# Patient Record
Sex: Female | Born: 1940 | Hispanic: No | Smoking: Never smoker
Health system: Southern US, Community
[De-identification: ages and names within clinical notes are randomized; demographics above are authoritative.]

## PROBLEM LIST (undated history)

## (undated) DIAGNOSIS — F0280 Dementia in other diseases classified elsewhere without behavioral disturbance: Secondary | ICD-10-CM

## (undated) DIAGNOSIS — F32A Depression, unspecified: Secondary | ICD-10-CM

## (undated) DIAGNOSIS — G309 Alzheimer's disease, unspecified: Secondary | ICD-10-CM

## (undated) DIAGNOSIS — I639 Cerebral infarction, unspecified: Secondary | ICD-10-CM

## (undated) DIAGNOSIS — F329 Major depressive disorder, single episode, unspecified: Secondary | ICD-10-CM

## (undated) DIAGNOSIS — I2510 Atherosclerotic heart disease of native coronary artery without angina pectoris: Secondary | ICD-10-CM

## (undated) DIAGNOSIS — N183 Chronic kidney disease, stage 3 unspecified: Secondary | ICD-10-CM

## (undated) DIAGNOSIS — E46 Unspecified protein-calorie malnutrition: Secondary | ICD-10-CM

## (undated) DIAGNOSIS — I10 Essential (primary) hypertension: Secondary | ICD-10-CM

## (undated) DIAGNOSIS — F0390 Unspecified dementia without behavioral disturbance: Secondary | ICD-10-CM

## (undated) DIAGNOSIS — M810 Age-related osteoporosis without current pathological fracture: Secondary | ICD-10-CM

---

## 2008-05-27 ENCOUNTER — Ambulatory Visit: Payer: Self-pay | Admitting: Family Medicine

## 2008-05-27 LAB — CONVERTED CEMR LAB
AST: 17 units/L (ref 0–37)
Albumin: 4.2 g/dL (ref 3.5–5.2)
Alkaline Phosphatase: 96 units/L (ref 39–117)
BUN: 20 mg/dL (ref 6–23)
Basophils Relative: 0 % (ref 0–1)
Creatinine, Ser: 0.92 mg/dL (ref 0.40–1.20)
Eosinophils Absolute: 0.1 10*3/uL (ref 0.0–0.7)
Eosinophils Relative: 2 % (ref 0–5)
Glucose, Bld: 86 mg/dL (ref 70–99)
HCT: 40.3 % (ref 36.0–46.0)
HDL: 46 mg/dL (ref >39)
LDL Cholesterol: 81 mg/dL (ref 0–99)
Lymphs Abs: 1.7 10*3/uL (ref 0.7–4.0)
MCHC: 32 g/dL (ref 30.0–36.0)
MCV: 87.8 fL (ref 78.0–100.0)
Monocytes Absolute: 0.7 10*3/uL (ref 0.1–1.0)
Monocytes Relative: 11 % (ref 3–12)
RBC: 4.59 M/uL (ref 3.87–5.11)
TSH: 1.543 microintl units/mL (ref 0.350–4.50)
Total Bilirubin: 0.7 mg/dL (ref 0.3–1.2)
Total CHOL/HDL Ratio: 3.2
Triglycerides: 95 mg/dL (ref <150)
VLDL: 19 mg/dL (ref 0–40)
WBC: 6.1 10*3/uL (ref 4.0–10.5)

## 2008-06-23 ENCOUNTER — Ambulatory Visit: Payer: Self-pay | Admitting: Family Medicine

## 2008-06-28 ENCOUNTER — Ambulatory Visit (HOSPITAL_COMMUNITY): Admission: RE | Admit: 2008-06-28 | Discharge: 2008-06-28 | Payer: Self-pay | Admitting: Family Medicine

## 2008-07-12 ENCOUNTER — Ambulatory Visit: Payer: Self-pay | Admitting: Family Medicine

## 2008-08-25 ENCOUNTER — Ambulatory Visit: Payer: Self-pay | Admitting: Family Medicine

## 2008-08-26 ENCOUNTER — Ambulatory Visit (HOSPITAL_COMMUNITY): Admission: RE | Admit: 2008-08-26 | Discharge: 2008-08-26 | Payer: Self-pay | Admitting: Family Medicine

## 2008-09-12 ENCOUNTER — Encounter: Admission: RE | Admit: 2008-09-12 | Discharge: 2008-12-11 | Payer: Self-pay | Admitting: Family Medicine

## 2009-01-19 ENCOUNTER — Ambulatory Visit: Payer: Self-pay | Admitting: Family Medicine

## 2009-02-24 ENCOUNTER — Ambulatory Visit: Payer: Self-pay | Admitting: Internal Medicine

## 2009-02-27 ENCOUNTER — Ambulatory Visit: Payer: Self-pay | Admitting: Internal Medicine

## 2010-06-05 ENCOUNTER — Emergency Department (HOSPITAL_COMMUNITY): Admission: EM | Admit: 2010-06-05 | Discharge: 2010-06-05 | Payer: Self-pay | Admitting: Emergency Medicine

## 2010-10-31 LAB — CBC
MCH: 29.9 pg (ref 26.0–34.0)
MCHC: 33.8 g/dL (ref 30.0–36.0)
Platelets: 220 10*3/uL (ref 150–400)

## 2010-10-31 LAB — URINE MICROSCOPIC-ADD ON

## 2010-10-31 LAB — URINALYSIS, ROUTINE W REFLEX MICROSCOPIC
Bilirubin Urine: NEGATIVE
Glucose, UA: NEGATIVE mg/dL
Nitrite: NEGATIVE
Specific Gravity, Urine: 1.019 (ref 1.005–1.030)
pH: 7 (ref 5.0–8.0)

## 2010-10-31 LAB — BASIC METABOLIC PANEL
CO2: 29 mEq/L (ref 19–32)
Calcium: 10.3 mg/dL (ref 8.4–10.5)
Creatinine, Ser: 1.1 mg/dL (ref 0.4–1.2)
GFR calc Af Amer: >60 mL/min (ref >60)
GFR calc non Af Amer: 50 mL/min — ABNORMAL LOW (ref >60)
Glucose, Bld: 106 mg/dL — ABNORMAL HIGH (ref 70–99)

## 2010-10-31 LAB — URINE CULTURE

## 2010-10-31 LAB — DIFFERENTIAL
Basophils Absolute: 0 10*3/uL (ref 0.0–0.1)
Basophils Relative: 0 % (ref 0–1)
Eosinophils Absolute: 0.1 10*3/uL (ref 0.0–0.7)
Neutrophils Relative %: 84 % — ABNORMAL HIGH (ref 43–77)

## 2011-01-23 ENCOUNTER — Inpatient Hospital Stay (HOSPITAL_COMMUNITY)
Admission: EM | Admit: 2011-01-23 | Discharge: 2011-01-25 | DRG: 159 | Disposition: A | Discharge status: Nursing Home (Includes ICF) | Payer: Medicare Other | Attending: Internal Medicine | Admitting: Internal Medicine

## 2011-01-23 ENCOUNTER — Emergency Department (HOSPITAL_COMMUNITY): Payer: Medicare Other

## 2011-01-23 DIAGNOSIS — K047 Periapical abscess without sinus: Principal | ICD-10-CM | POA: Diagnosis present

## 2011-01-23 DIAGNOSIS — K029 Dental caries, unspecified: Secondary | ICD-10-CM | POA: Diagnosis present

## 2011-01-23 DIAGNOSIS — Z8673 Personal history of transient ischemic attack (TIA), and cerebral infarction without residual deficits: Secondary | ICD-10-CM

## 2011-01-23 DIAGNOSIS — F0390 Unspecified dementia without behavioral disturbance: Secondary | ICD-10-CM | POA: Diagnosis present

## 2011-01-23 DIAGNOSIS — I10 Essential (primary) hypertension: Secondary | ICD-10-CM | POA: Diagnosis present

## 2011-01-23 DIAGNOSIS — F329 Major depressive disorder, single episode, unspecified: Secondary | ICD-10-CM | POA: Diagnosis present

## 2011-01-23 DIAGNOSIS — F3289 Other specified depressive episodes: Secondary | ICD-10-CM | POA: Diagnosis present

## 2011-01-23 LAB — POCT I-STAT, CHEM 8
BUN: 20 mg/dL (ref 6–23)
Calcium, Ion: 1.21 mmol/L (ref 1.12–1.32)
Chloride: 103 mEq/L (ref 96–112)
Creatinine, Ser: 1.1 mg/dL (ref 0.4–1.2)
Glucose, Bld: 108 mg/dL — ABNORMAL HIGH (ref 70–99)

## 2011-01-23 LAB — DIFFERENTIAL
Eosinophils Absolute: 0.1 10*3/uL (ref 0.0–0.7)
Eosinophils Relative: 0 % (ref 0–5)
Lymphs Abs: 0.9 10*3/uL (ref 0.7–4.0)
Monocytes Relative: 9 % (ref 3–12)
Neutrophils Relative %: 85 % — ABNORMAL HIGH (ref 43–77)

## 2011-01-23 LAB — CBC
MCH: 28.7 pg (ref 26.0–34.0)
MCV: 86.4 fL (ref 78.0–100.0)
Platelets: 292 10*3/uL (ref 150–400)
RBC: 4.64 MIL/uL (ref 3.87–5.11)

## 2011-01-23 MED ORDER — IOHEXOL 300 MG/ML  SOLN
100.0000 mL | Freq: Once | INTRAMUSCULAR | Status: AC | PRN
  Administered 2011-01-23: 100 mL via INTRAVENOUS

## 2011-01-24 ENCOUNTER — Inpatient Hospital Stay (HOSPITAL_COMMUNITY): Payer: Medicare Other

## 2011-01-24 LAB — DIFFERENTIAL
Basophils Absolute: 0 10*3/uL (ref 0.0–0.1)
Eosinophils Relative: 1 % (ref 0–5)
Lymphocytes Relative: 11 % — ABNORMAL LOW (ref 12–46)
Monocytes Absolute: 1.3 10*3/uL — ABNORMAL HIGH (ref 0.1–1.0)

## 2011-01-24 LAB — CBC
HCT: 35.6 % — ABNORMAL LOW (ref 36.0–46.0)
MCH: 28.8 pg (ref 26.0–34.0)
MCHC: 33.4 g/dL (ref 30.0–36.0)
MCV: 86.2 fL (ref 78.0–100.0)
RDW: 13.1 % (ref 11.5–15.5)

## 2011-01-24 LAB — COMPREHENSIVE METABOLIC PANEL
BUN: 14 mg/dL (ref 6–23)
Calcium: 9.2 mg/dL (ref 8.4–10.5)
Creatinine, Ser: 0.79 mg/dL (ref 0.4–1.2)
Glucose, Bld: 121 mg/dL — ABNORMAL HIGH (ref 70–99)
Sodium: 137 mEq/L (ref 135–145)
Total Protein: 7.1 g/dL (ref 6.0–8.3)

## 2011-01-24 LAB — MAGNESIUM: Magnesium: 2.2 mg/dL (ref 1.5–2.5)

## 2011-01-24 NOTE — H&P (Signed)
Alexa Knox, SCHILLER             ACCOUNT NO.:  [id number]  MEDICAL RECORD NO.:  [id number]  LOCATION:  WLED                         FACILITY:  [facility]  PHYSICIAN:  Talmage Nap, MD  DATE OF BIRTH:  02-Feb-1941  DATE OF ADMISSION:  01/23/2011 DATE OF DISCHARGE:                             HISTORY & PHYSICAL   PRIMARY CARE PHYSICIAN:  Unassigned.  History obtainable from the patient (the patient not a very good historian).  CHIEF COMPLAINT:  Right jaw swelling of about 4 days' duration.  HISTORY OF PRESENT ILLNESS:  The patient is a 70 year old Caucasian female who is resident of an assisted living facility at [facility] with a history of hypertension, dementia, and CVA, presented to the emergency room with right jaw swelling of about 4-5 days' duration.  The swelling is said to be getting progressively worse.  This was said to be associated with fever. She denied, any history of chills or rigor. She denied any dysphagia.  She denied any chest pain.  No shortness of breath.  She denied any history of headaches.  No neck stiffness.  No cough.  The right jaw swelling was said to be getting progressive worse, hence the patient presented to the emergency room to be evaluated.  PAST MEDICAL HISTORY:  Positive for: 1. Hypertension. 2. CVA. 3. Depression. 4. Dementia.  PAST SURGICAL HISTORY:  No known past surgical history.  MEDICATIONS:  Her preadmission medications include: 1. Alendronate 70 mg p.o. daily. 2. Bupropion 150 mg p.o. daily. 3. Diphenhydramine HCL 25 mg p.o. p.r.n. 4. Hydrochlorothiazide 25 mg p.o. daily. 5. Lorazepam 0.5 mg p.o. p.r.n. 6. Nystatin topical apply p.r.n. 7. Vitamin D2 dose unknown once a day.  ALLERGIES:  She has no known drug allergies.  SOCIAL HISTORY:  Negative for alcohol or tobacco use.  She is a resident of assisted living facility at [facility].  FAMILY HISTORY:  Unknown.  REVIEW OF SYSTEMS:  She denies any headache.  No blurry  vision.  No neck stiffness.  Complaining about pain and swelling of the right jaw with associated fever.  Denied any chills or rigor.  No dysphagia.  No cough. No chest pain.  No shortness of breath.  No abdominal discomfort.  No diarrhea or hematochezia.  No dysuria or hematuria. No swelling of the lower extremities.  No intolerance to heat or cold. No neuropsychiatric disorder.  PHYSICAL EXAMINATION:  GENERAL:  Elderly lady with suboptimal hydration, not in any respiratory distress at present. VITAL SIGNS:  Blood pressure 137/80, pulse 92, respiratory rate 16, temperature is 101.5. HEENT:  Pupils are reactive to light.  Extraocular muscles are intact. NECK:  Right jaw swelling with surrounding erythema, warm to touch and tender.  No lymphadenopathy.  Examination of mouth showed very poor dentition with surrounding erythema around the right molar teeth. CHEST:  Clear to auscultation. HEART:  S1 and S2. ABDOMEN:  Soft and nontender.  Liver, spleen, and kidneys not palpable. Bowel sounds are positive. EXTREMITIES:  No pedal edema. NEUROLOGIC:  Nonfocal. MUSCULOSKELETAL:  Unremarkable. SKIN:  Decreased turgor.  LABORATORY DATA:  Initial hematologic indices showed WBC of 14.3, hemoglobin of 13.3, hematocrit of 40.1, MCV 86.5, with platelet count of 292,000, neutrophils 85%,  absolute neutrophil count is 12.1.  Chemistry showed sodium of 139, potassium of 3.5, chloride of 102, BUN 20, creatinine 1.10, glucose 108.  CT of the maxillofacial with contrast showed a large dental abscess on the floor of the mouth measuring 3.7 cm, this extends to the submandibular gland which is markedly enlarged.   IMPRESSION: 1. Right jaw swelling secondary to dental abscess. 2. Right molar teeth periodontitis with gingivitis. 3. Depression. 4. Hypertension. 5. History of cerebrovascular accident, no neurologic deficits. 6. Dementia.  PLAN:  Admit the patient to general medical floor.  The patient  will be n.p.o. for now.  She will be on D5 half normal saline IV to go at a rate of 75 cc an hour.  She will be started on antibiotic i.e clindamycin 600 mg IV q.8 hours.  Pain control will be with morphine 2 mg IV q.4 h. p.r.n. and fever will be controlled with Tylenol suppository 1 q.4h. p.r.n. for fever.  GI prophylaxis will be with Protonix 40 mg IV q.24 h. and DVT prophylaxis with TED stockings or SCD boots.  Further workup to be done on the patient will include CBC and CMP, will be repeated in a.m. Blood cultures x2 before starting IV antibiotics.  I was informed  by the ED physician that the oral surgeon, Dr. Gwendlyn Deutscher was aware of this patient and will evaluate the patient in a.m.  The patient is on the floor and will be evaluated on day-to-day basis.    Talmage Nap, MD    CN/MEDQ  D:  01/23/2011  T:  01/23/2011  Job:  [id number]  Electronically Signed by Talmage Nap  on 01/24/2011 02:50:34 AM

## 2011-01-30 LAB — CULTURE, BLOOD (ROUTINE X 2)
Culture  Setup Time: 201206070438
Culture: NO GROWTH

## 2011-02-04 NOTE — Discharge Summary (Signed)
[name], Knox             ACCOUNT NO.:  [id number]  MEDICAL RECORD NO.:  [id number]  LOCATION:  1314                         FACILITY:  [facility]  PHYSICIAN:  Marinda Elk, M.D.DATE OF BIRTH:  07/30/1941  DATE OF ADMISSION:  01/23/2011 DATE OF DISCHARGE:                         DISCHARGE SUMMARY-REFERRING   PRIMARY CARE DOCTOR:  None.  DISCHARGE DIAGNOSES: 1. Dental abscess. 2. Hypertension.  DISCHARGE MEDICATIONS: 1. Clindamycin 600 mg t.i.d. for 6 days. 2. Bupropion XL 150 mg daily. 3. Benadryl 25 mg q.6 h. 4. Donazepil 10 mg daily. 5. Hydrochlorothiazide 12.5 mg daily. 6. Lorazepam 0.5 mg 1 tab q.6 h. as needed. 7. Multivitamin 1 tab daily. 8. Nystatin topically b.i.d. to buttcoks and upper thigh. 9. Vitamin D2 400 units daily.  PROCEDURES PERFORMED:  Mouth panogram  showed right lower bridging transferred  between molar and premolar with lucency along the premolar which may represent a source.  There are,  however, significant caries and loosening of residual lower canine roots.  CT scan of the maxillofacial  area:  Large abscess  in the floor of mouth likely due to dental disease.  This process extends down to the submandibular gland which is markedly  inflamed.  BRIEF ADMITTING HISTORY AND PHYSICAL:  This is a 70 year old Caucasian female who resides in ALF, brought to the emergency room because of jaw swelling.  The swelling said to be progressively getting worse.  This said to be associated with fever.  She denies any history of chills , regurg,  dysphagia.  Denies any chest pain, shortness of breath.  No cough, no right jaw swelling, said to be progressively getting worse which has limited her eating.  PHYSICAL EXAMINATION:  VITAL SIGNS:  Blood pressure 137/80, pulse of 92, respirations 16, temperature 101.5.  Please refer to dictation from January 24, 2011, for further details.  LABS ON ADMISSION:  White count of 14, hemoglobin of 13, platelet  count of 292,000, MCV of 86, ANC of 12.1.  Sodium 139, potassium 3.5, chloride 102, BUN of 20, creatinine 1.0, glucose of 108.  CT scan shows results above.  ASSESSMENT/PLAN: 1. Right jaw swelling secondary to dental abscess.  She was started on     clindamycin.  Then, maxillofacial was consulted.  They recommended     that due to her improvement with her antibiotic, they could do it     probably as an outpatient.  She was continuing clindamycin and     continued to do well.  She defervesced, her white count came down.     She was scheduled to follow up appointment on June 11 for     extraction of this abscess.  In the meantime, she will continue her     clindamycin p.o.  She has tolerated her diet well in the hospital.     She was discharged in stable addition. 2. Hypertension, currently well-controlled.  No changes were made. 3. Depression.  No changes were made. 4. Dementia.  No changes were made to the medication.  DISPOSITION:  The patient will follow up with the dentist as an outpatient.  Here we will do the procedure to extract and debride the source of the infection.  Her  appointment has already been set up by her dental doctor.  We will discuss with the family that she needs to follow up with this appointment.  On the day of discharge, temperature is 97, pulse 68, respirations 17, blood pressure 115/73.  She was satting 95% on room air.     Marinda Elk, M.D.     AF/MEDQ  D:  01/25/2011  T:  01/25/2011  Job:  [id number]  cc:   Saddie Benders, D.D.S. Fax: [phone]  Electronically Signed by Marinda Elk M.D. on 02/04/2011 10:57:20 AM

## 2011-03-23 ENCOUNTER — Emergency Department (HOSPITAL_COMMUNITY)
Admission: EM | Admit: 2011-03-23 | Discharge: 2011-03-23 | Disposition: A | Discharge status: Home / Self Care | Payer: Medicare Other | Attending: Emergency Medicine | Admitting: Emergency Medicine

## 2011-03-23 ENCOUNTER — Emergency Department (HOSPITAL_COMMUNITY): Payer: Medicare Other

## 2011-03-23 DIAGNOSIS — I10 Essential (primary) hypertension: Secondary | ICD-10-CM | POA: Insufficient documentation

## 2011-03-23 DIAGNOSIS — Z66 Do not resuscitate: Secondary | ICD-10-CM | POA: Insufficient documentation

## 2011-03-23 DIAGNOSIS — M810 Age-related osteoporosis without current pathological fracture: Secondary | ICD-10-CM | POA: Insufficient documentation

## 2011-03-23 DIAGNOSIS — F068 Other specified mental disorders due to known physiological condition: Secondary | ICD-10-CM | POA: Insufficient documentation

## 2011-03-23 DIAGNOSIS — S62639B Displaced fracture of distal phalanx of unspecified finger, initial encounter for open fracture: Secondary | ICD-10-CM | POA: Insufficient documentation

## 2011-03-23 DIAGNOSIS — Z8673 Personal history of transient ischemic attack (TIA), and cerebral infarction without residual deficits: Secondary | ICD-10-CM | POA: Insufficient documentation

## 2013-08-10 ENCOUNTER — Emergency Department (HOSPITAL_COMMUNITY)
Admission: EM | Admit: 2013-08-10 | Discharge: 2013-08-10 | Disposition: A | Discharge status: Home / Self Care | Payer: Medicare Other | Attending: Emergency Medicine | Admitting: Emergency Medicine

## 2013-08-10 ENCOUNTER — Emergency Department (HOSPITAL_COMMUNITY): Payer: Medicare Other

## 2013-08-10 ENCOUNTER — Encounter (HOSPITAL_COMMUNITY): Payer: Self-pay | Admitting: Emergency Medicine

## 2013-08-10 DIAGNOSIS — F329 Major depressive disorder, single episode, unspecified: Secondary | ICD-10-CM | POA: Insufficient documentation

## 2013-08-10 DIAGNOSIS — F0390 Unspecified dementia without behavioral disturbance: Secondary | ICD-10-CM | POA: Insufficient documentation

## 2013-08-10 DIAGNOSIS — F3289 Other specified depressive episodes: Secondary | ICD-10-CM | POA: Insufficient documentation

## 2013-08-10 DIAGNOSIS — M810 Age-related osteoporosis without current pathological fracture: Secondary | ICD-10-CM | POA: Insufficient documentation

## 2013-08-10 DIAGNOSIS — E860 Dehydration: Secondary | ICD-10-CM | POA: Insufficient documentation

## 2013-08-10 DIAGNOSIS — I10 Essential (primary) hypertension: Secondary | ICD-10-CM | POA: Insufficient documentation

## 2013-08-10 DIAGNOSIS — Z7982 Long term (current) use of aspirin: Secondary | ICD-10-CM | POA: Insufficient documentation

## 2013-08-10 DIAGNOSIS — Z7983 Long term (current) use of bisphosphonates: Secondary | ICD-10-CM | POA: Insufficient documentation

## 2013-08-10 DIAGNOSIS — Z79899 Other long term (current) drug therapy: Secondary | ICD-10-CM | POA: Insufficient documentation

## 2013-08-10 DIAGNOSIS — I951 Orthostatic hypotension: Secondary | ICD-10-CM | POA: Insufficient documentation

## 2013-08-10 DIAGNOSIS — Z8639 Personal history of other endocrine, nutritional and metabolic disease: Secondary | ICD-10-CM | POA: Insufficient documentation

## 2013-08-10 DIAGNOSIS — R231 Pallor: Secondary | ICD-10-CM | POA: Insufficient documentation

## 2013-08-10 DIAGNOSIS — I2510 Atherosclerotic heart disease of native coronary artery without angina pectoris: Secondary | ICD-10-CM | POA: Insufficient documentation

## 2013-08-10 HISTORY — DX: Atherosclerotic heart disease of native coronary artery without angina pectoris: I25.10

## 2013-08-10 HISTORY — DX: Essential (primary) hypertension: I10

## 2013-08-10 HISTORY — DX: Age-related osteoporosis without current pathological fracture: M81.0

## 2013-08-10 HISTORY — DX: Major depressive disorder, single episode, unspecified: F32.9

## 2013-08-10 HISTORY — DX: Unspecified protein-calorie malnutrition: E46

## 2013-08-10 HISTORY — DX: Depression, unspecified: F32.A

## 2013-08-10 HISTORY — DX: Unspecified dementia, unspecified severity, without behavioral disturbance, psychotic disturbance, mood disturbance, and anxiety: F03.90

## 2013-08-10 LAB — CBC WITH DIFFERENTIAL/PLATELET
Basophils Absolute: 0 10*3/uL (ref 0.0–0.1)
Eosinophils Relative: 3 % (ref 0–5)
HCT: 37.8 % (ref 36.0–46.0)
Hemoglobin: 12.2 g/dL (ref 12.0–15.0)
Lymphocytes Relative: 17 % (ref 12–46)
MCV: 92.4 fL (ref 78.0–100.0)
Monocytes Absolute: 0.7 10*3/uL (ref 0.1–1.0)
Monocytes Relative: 13 % — ABNORMAL HIGH (ref 3–12)
Neutro Abs: 3.6 10*3/uL (ref 1.7–7.7)
RDW: 13.6 % (ref 11.5–15.5)
WBC: 5.3 10*3/uL (ref 4.0–10.5)

## 2013-08-10 LAB — VALPROIC ACID LEVEL: Valproic Acid Lvl: 30.5 ug/mL — ABNORMAL LOW (ref 50.0–100.0)

## 2013-08-10 LAB — COMPREHENSIVE METABOLIC PANEL
BUN: 29 mg/dL — ABNORMAL HIGH (ref 6–23)
CO2: 25 mEq/L (ref 19–32)
Calcium: 8.6 mg/dL (ref 8.4–10.5)
Chloride: 108 mEq/L (ref 96–112)
Creatinine, Ser: 0.99 mg/dL (ref 0.50–1.10)
GFR calc Af Amer: 65 mL/min — ABNORMAL LOW (ref >90)
GFR calc non Af Amer: 56 mL/min — ABNORMAL LOW (ref >90)
Glucose, Bld: 101 mg/dL — ABNORMAL HIGH (ref 70–99)
Total Bilirubin: 0.4 mg/dL (ref 0.3–1.2)

## 2013-08-10 LAB — TROPONIN I: Troponin I: <0.3 ng/mL (ref <0.30)

## 2013-08-10 LAB — URINALYSIS, ROUTINE W REFLEX MICROSCOPIC
Glucose, UA: NEGATIVE mg/dL
Ketones, ur: NEGATIVE mg/dL
Leukocytes, UA: NEGATIVE
Protein, ur: NEGATIVE mg/dL
Urobilinogen, UA: 0.2 mg/dL (ref 0.0–1.0)

## 2013-08-10 MED ORDER — SODIUM CHLORIDE 0.9 % IV BOLUS (SEPSIS)
700.0000 mL | Freq: Once | INTRAVENOUS | Status: AC
  Administered 2013-08-10: 700 mL via INTRAVENOUS

## 2013-08-10 NOTE — ED Notes (Signed)
Bed: WA19 Expected date:  Expected time:  Means of arrival:  Comments: EMS 

## 2013-08-10 NOTE — ED Notes (Signed)
Per EMS: Pt from [facility].  Called out for syncope.  On scene, pt's BP was 108 palp laying down.  Upon sitting, BP dropped to 70/40.  Did not attempt standing BP.  Pt has 22 g IV in lt hand.  Pt is confused.  Staff at nursing facility states that she is normally "sharp as a tack".

## 2013-08-10 NOTE — Progress Notes (Signed)
   CARE MANAGEMENT ED NOTE 08/10/2013  Patient:  Alexa Knox, Alexa Knox   Account Number:  [id number]  Date Initiated:  08/10/2013  Documentation initiated by:  Edd Arbour  Subjective/Objective Assessment:   72 yr old female pcp henry tripp per EDP note     Subjective/Objective Assessment Detail:     Action/Plan:   EPIC updated   Action/Plan Detail:   Anticipated DC Date:  08/10/2013     Status Recommendation to Physician:   Result of Recommendation:    Other ED Services  Consult Working Plan    DC Planning Services  Outpatient Services - Pt will follow up  Other    Choice offered to / List presented to:            Status of service:  Completed, signed off  ED Comments:   ED Comments Detail:

## 2013-08-10 NOTE — ED Provider Notes (Signed)
CSN: [id number]     Arrival date & time 08/10/13  0806 History   First MD Initiated Contact with Patient 08/10/13 0820     Chief Complaint  Patient presents with  . Loss of Consciousness   Level V caveat for dementia  (Consider location/radiation/quality/duration/timing/severity/associated sxs/prior Treatment) HPI Patient presents from her nursing home for apparent syncopal episode. According to the nursing home paper it states patient passed out and was unable to walk. EMS attempted to do orthostatics on the patient. Her blood pressure supine was 108 however when they sat her up her blood pressure dropped to 70/40. When I asked the patient how she's feeling she states "I feel fine". She denies headache, chest pain, or feeling bad in any way. She states she was up for the day had already eaten breakfast however I am unsure if this is true or not. Patient states she is in a hospital, nursing tech states she had oriented her just prior to my coming into the room.  PCP [name]  Patient is DO NOT RESUSCITATE  Past Medical History  Diagnosis Date  . Osteoporosis   . Dementia   . Depression   . Hypertension   . Malnutrition   . Coronary artery disease    History reviewed. No pertinent past surgical history. History reviewed. No pertinent family history. History  Substance Use Topics  . Smoking status: Never Smoker   . Smokeless tobacco: Not on file  . Alcohol Use: No   Lives in nursing home   OB History   Grav Para Term Preterm Abortions TAB SAB Ect Mult Living                 Review of Systems  Unable to perform ROS: Dementia    Allergies  Review of patient's allergies indicates no known allergies.  Home Medications   Current Outpatient Rx  Name  Route  Sig  Dispense  Refill  . alendronate (FOSAMAX) 70 MG tablet   Oral   Take 70 mg by mouth once a week. Take with a full glass of water on an empty stomach. Patient takes on Sat.         Marland Kitchen aspirin 81 MG tablet    Oral   Take 81 mg by mouth daily.         Marland Kitchen buPROPion (WELLBUTRIN SR) 150 MG 12 hr tablet   Oral   Take 150 mg by mouth every morning.         . chlorhexidine (PERIDEX) 0.12 % solution   Mouth/Throat   Use as directed 15 mLs in the mouth or throat 2 (two) times daily.         . divalproex (DEPAKOTE SPRINKLE) 125 MG capsule   Oral   Take 250 mg by mouth 3 (three) times daily. With meals, may open capsule and sprinkle on pudding         . donepezil (ARICEPT) 10 MG tablet   Oral   Take 10 mg by mouth daily.         Marland Kitchen LORazepam (ATIVAN) 0.5 MG tablet   Oral   Take 0.5 mg by mouth every 6 (six) hours as needed for anxiety (agitation).         . Memantine HCl ER (NAMENDA XR) 7 MG CP24   Oral   Take 1 capsule by mouth daily.         . Multiple Vitamin (DAILY VITE) TABS   Oral   Take 1 tablet  by mouth daily.         . vitamin D, CHOLECALCIFEROL, 400 UNITS tablet   Oral   Take 400 Units by mouth daily.          BP 107/50  Pulse 62  Temp(Src) 97.1 F (36.2 C) (Oral)  Resp 16  SpO2 98%  Vital signs normal   Physical Exam  Nursing note and vitals reviewed. Constitutional: She appears well-developed and well-nourished.  Non-toxic appearance. She does not appear ill. No distress.  HENT:  Head: Normocephalic and atraumatic.  Right Ear: External ear normal.  Left Ear: External ear normal.  Nose: Nose normal. No mucosal edema or rhinorrhea.  Mouth/Throat: Mucous membranes are normal. No dental abscesses or uvula swelling.  edentulous  Eyes: Conjunctivae and EOM are normal. Pupils are equal, round, and reactive to light.  Neck: Normal range of motion and full passive range of motion without pain. Neck supple.  Cardiovascular: Normal rate, regular rhythm and normal heart sounds.  Exam reveals no gallop and no friction rub.   No murmur heard. Pulmonary/Chest: Effort normal and breath sounds normal. No respiratory distress. She has no wheezes. She has no  rhonchi. She has no rales. She exhibits no tenderness and no crepitus.  Abdominal: Soft. Normal appearance and bowel sounds are normal. She exhibits no distension. There is no tenderness. There is no rebound and no guarding.  Musculoskeletal: Normal range of motion. She exhibits no edema and no tenderness.  Moves all extremities well. No deformity seen. She has no pain on range of motion of her knees, hips, elbows or shoulders.  Neurological: She is alert. She has normal strength. No cranial nerve deficit.  Follows commands  Skin: Skin is warm, dry and intact. No rash noted. No erythema. There is pallor.  Psychiatric: She has a normal mood and affect. Her speech is normal and behavior is normal. Her mood appears not anxious.    ED Course  Procedures (including critical care time) Medications  sodium chloride 0.9 % bolus 700 mL (700 mLs Intravenous New Bag/Given 08/10/13 0856)    Recheck. Patient does not remember me seeing her before. She states she feels fine. Her orthostatics are normal now after getting IV fluids. She will be sent back to her nursing facility.  Labs Review Results for orders placed during the hospital encounter of 08/10/13  CBC WITH DIFFERENTIAL      Result Value Range   WBC 5.3  4.0 - 10.5 K/uL   RBC 4.09  3.87 - 5.11 MIL/uL   Hemoglobin 12.2  12.0 - 15.0 g/dL   HCT 16.1  09.6 - 04.5 %   MCV 92.4  78.0 - 100.0 fL   MCH 29.8  26.0 - 34.0 pg   MCHC 32.3  30.0 - 36.0 g/dL   RDW 40.9  81.1 - 91.4 %   Platelets 130 (*) 150 - 400 K/uL   Neutrophils Relative % 67  43 - 77 %   Neutro Abs 3.6  1.7 - 7.7 K/uL   Lymphocytes Relative 17  12 - 46 %   Lymphs Abs 0.9  0.7 - 4.0 K/uL   Monocytes Relative 13 (*) 3 - 12 %   Monocytes Absolute 0.7  0.1 - 1.0 K/uL   Eosinophils Relative 3  0 - 5 %   Eosinophils Absolute 0.1  0.0 - 0.7 K/uL   Basophils Relative 0  0 - 1 %   Basophils Absolute 0.0  0.0 - 0.1 K/uL  COMPREHENSIVE METABOLIC  PANEL      Result Value Range    Sodium 140  135 - 145 mEq/L   Potassium 4.6  3.5 - 5.1 mEq/L   Chloride 108  96 - 112 mEq/L   CO2 25  19 - 32 mEq/L   Glucose, Bld 101 (*) 70 - 99 mg/dL   BUN 29 (*) 6 - 23 mg/dL   Creatinine, Ser 1.61  0.50 - 1.10 mg/dL   Calcium 8.6  8.4 - 09.6 mg/dL   Total Protein 6.4  6.0 - 8.3 g/dL   Albumin 3.1 (*) 3.5 - 5.2 g/dL   AST 18  0 - 37 U/L   ALT 17  0 - 35 U/L   Alkaline Phosphatase 55  39 - 117 U/L   Total Bilirubin 0.4  0.3 - 1.2 mg/dL   GFR calc non Af Amer 56 (*) >90 mL/min   GFR calc Af Amer 65 (*) >90 mL/min  TROPONIN I      Result Value Range   Troponin I <0.30  <0.30 ng/mL  URINALYSIS, ROUTINE W REFLEX MICROSCOPIC      Result Value Range   Color, Urine YELLOW  YELLOW   APPearance CLOUDY (*) CLEAR   Specific Gravity, Urine 1.015  1.005 - 1.030   pH 7.5  5.0 - 8.0   Glucose, UA NEGATIVE  NEGATIVE mg/dL   Hgb urine dipstick NEGATIVE  NEGATIVE   Bilirubin Urine NEGATIVE  NEGATIVE   Ketones, ur NEGATIVE  NEGATIVE mg/dL   Protein, ur NEGATIVE  NEGATIVE mg/dL   Urobilinogen, UA 0.2  0.0 - 1.0 mg/dL   Nitrite NEGATIVE  NEGATIVE   Leukocytes, UA NEGATIVE  NEGATIVE  VALPROIC ACID LEVEL      Result Value Range   Valproic Acid Lvl 30.5 (*) 50.0 - 100.0 ug/mL   Laboratory interpretation all normal except subtherapeutic valproic acid level    Imaging Review Ct Head Wo Contrast  Ct Cervical Spine Wo Contrast  08/10/2013   CLINICAL DATA:  Loss of consciousness with questionable trauma  EXAM: CT HEAD WITHOUT CONTRAST  CT CERVICAL SPINE WITHOUT CONTRAST  TECHNIQUE: Multidetector CT imaging of the head and cervical spine was performed following the standard protocol without intravenous contrast. Multiplanar CT image reconstructions of the cervical spine were also generated.  COMPARISON:  Brain CT June 05, 2010  FINDINGS: CT HEAD FINDINGS  There is moderate diffuse atrophy. There is no mass, hemorrhage, extra-axial fluid collection, or midline shift. There is small vessel  disease throughout the centra semiovale bilaterally. There is evidence of a prior infarct in the inferior posterior right parietal lobe, stable. There is no new gray-white compartment lesion. No acute infarct apparent.  Bony calvarium appears intact. Mastoid air cells are clear, although mastoids on the right are hypoplastic. There is debris in the right external auditory canal.  CT CERVICAL SPINE FINDINGS  There is no fracture or appreciable spondylolisthesis. Prevertebral soft tissues and predental space regions are normal.  There is marked disc space narrowing at C5-6 and C6-7. There is moderate disc space narrowing at C3-4 and C4-5. There is facet hypertrophy at essentially all levels bilaterally. No disc extrusion or stenosis.  IMPRESSION: CT head: Atrophy with supratentorial small vessel disease. Prior infarct at the gray - white junction in the posterior inferior right parietal lobe. No intracranial mass, hemorrhage, or acute appearing infarct. Probable cerumen in the right external auditory canal.  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 No fracture or spondylolisthesis.  Electronically Signed   By: Bretta Bang M.D.   On: 08/10/2013 09:41   Dg Chest Portable 1 View  08/10/2013   CLINICAL DATA:  Loss consciousness, history of hypertension  EXAM: PORTABLE CHEST - 1 VIEW  COMPARISON:  None.  FINDINGS: The cardiac silhouette is enlarged. Low lung volumes. Blunting of the left costophrenic angle. Increased density projects within the left lower lobe and lingula regions. No focal regions of consolidation. The osseous structures are unremarkable.  IMPRESSION: Atelectasis versus infiltrate left lower lobe and lingula regions. Small effusion versus chronic scarring left costophrenic angle region.   Electronically Signed   By: Salome Holmes M.D.   On: 08/10/2013 09:09    EKG Interpretation    Date/Time:  Tuesday August 10 2013 08:33:32 EST Ventricular Rate:   63 PR Interval:  187 QRS Duration: 95 QT Interval:  397 QTC Calculation: 406 R Axis:   -2 Text Interpretation:  Sinus rhythm Baseline wander Since last tracing rate slower Confirmed by Joni Colegrove  MD-I, Quintella Mura ([id number]) on 08/10/2013 9:03:18 AM            MDM   1. Orthostatic hypotension   2. Dehydration    Plan discharge  Devoria Albe, MD, Franz Dell, MD 08/10/13 1128

## 2013-10-16 ENCOUNTER — Inpatient Hospital Stay (HOSPITAL_COMMUNITY)
Admission: EM | Admit: 2013-10-16 | Discharge: 2013-10-19 | DRG: 087 | Disposition: A | Discharge status: Skilled Nursing Facility | Payer: Medicare Other | Attending: Internal Medicine | Admitting: Internal Medicine

## 2013-10-16 ENCOUNTER — Emergency Department (HOSPITAL_COMMUNITY): Payer: Medicare Other

## 2013-10-16 ENCOUNTER — Encounter (HOSPITAL_COMMUNITY): Payer: Self-pay | Admitting: Emergency Medicine

## 2013-10-16 DIAGNOSIS — S06330A Contusion and laceration of cerebrum, unspecified, without loss of consciousness, initial encounter: Principal | ICD-10-CM | POA: Diagnosis present

## 2013-10-16 DIAGNOSIS — W06XXXA Fall from bed, initial encounter: Secondary | ICD-10-CM | POA: Diagnosis present

## 2013-10-16 DIAGNOSIS — F329 Major depressive disorder, single episode, unspecified: Secondary | ICD-10-CM | POA: Diagnosis present

## 2013-10-16 DIAGNOSIS — M810 Age-related osteoporosis without current pathological fracture: Secondary | ICD-10-CM | POA: Diagnosis present

## 2013-10-16 DIAGNOSIS — I2510 Atherosclerotic heart disease of native coronary artery without angina pectoris: Secondary | ICD-10-CM | POA: Diagnosis present

## 2013-10-16 DIAGNOSIS — F0390 Unspecified dementia without behavioral disturbance: Secondary | ICD-10-CM | POA: Diagnosis present

## 2013-10-16 DIAGNOSIS — I619 Nontraumatic intracerebral hemorrhage, unspecified: Secondary | ICD-10-CM

## 2013-10-16 DIAGNOSIS — I10 Essential (primary) hypertension: Secondary | ICD-10-CM | POA: Diagnosis present

## 2013-10-16 DIAGNOSIS — F3289 Other specified depressive episodes: Secondary | ICD-10-CM | POA: Diagnosis present

## 2013-10-16 DIAGNOSIS — Z79899 Other long term (current) drug therapy: Secondary | ICD-10-CM

## 2013-10-16 DIAGNOSIS — Y9210 Unspecified residential institution as the place of occurrence of the external cause: Secondary | ICD-10-CM | POA: Diagnosis present

## 2013-10-16 DIAGNOSIS — S0633AA Contusion and laceration of cerebrum, unspecified, with loss of consciousness status unknown, initial encounter: Principal | ICD-10-CM | POA: Diagnosis present

## 2013-10-16 DIAGNOSIS — Z66 Do not resuscitate: Secondary | ICD-10-CM | POA: Diagnosis present

## 2013-10-16 DIAGNOSIS — Z7982 Long term (current) use of aspirin: Secondary | ICD-10-CM

## 2013-10-16 NOTE — ED Notes (Signed)
Patient was seen in bed, patient was found 5 minutes later and found her on the floor.  Patient does have a hematoma on right forehead.  Patient is not complaining of any pain at this time.  [facility]  Patient has history of dementia.  Patient is CAOx normal baseline.

## 2013-10-16 NOTE — ED Notes (Signed)
[facility]

## 2013-10-16 NOTE — ED Provider Notes (Signed)
CSN: [id number]     Arrival date & time 10/16/13  2051 History   First MD Initiated Contact with Patient 10/16/13 2056     Chief Complaint  Patient presents with  . Fall   HPI Comments: 73 yo F hx of dementia, osteoporosis, HTN, CAD, presents via EMS from [facility]isted [facility] s/p unwitnessed fall.  Pt was found 1-2 hours ago lying on floor around her bed.  Pt has dementia an unable to give history.  Last seen in bed a few hours earlier.  Pt had evidence of some head trauma, with bruising and swelling to right forehead noted by staff.  EMS was called and pt brought to ED for further evaluation.  On arrival, pt has no complaints, but she has dementia and unable to participate in full H&P.    The history is provided by a relative. No language interpreter was used.    Past Medical History  Diagnosis Date  . Osteoporosis   . Dementia   . Depression   . Hypertension   . Malnutrition   . Coronary artery disease    History reviewed. No pertinent past surgical history. No family history on file. History  Substance Use Topics  . Smoking status: Never Smoker   . Smokeless tobacco: Not on file  . Alcohol Use: No   OB History   Grav Para Term Preterm Abortions TAB SAB Ect Mult Living                 Review of Systems  Unable to perform ROS: Dementia      Allergies  Review of patient's allergies indicates no known allergies.  Home Medications   Current Outpatient Rx  Name  Route  Sig  Dispense  Refill  . alendronate (FOSAMAX) 70 MG tablet   Oral   Take 70 mg by mouth once a week. Take with a full glass of water on an empty stomach. Patient takes on Sat.         Marland Kitchen aspirin 81 MG tablet   Oral   Take 81 mg by mouth daily.         Marland Kitchen buPROPion (WELLBUTRIN SR) 150 MG 12 hr tablet   Oral   Take 150 mg by mouth every morning.         . chlorhexidine (PERIDEX) 0.12 % solution   Mouth/Throat   Use as directed 15 mLs in the mouth or throat 2 (two) times daily.          . divalproex (DEPAKOTE SPRINKLE) 125 MG capsule   Oral   Take 250 mg by mouth 3 (three) times daily. With meals, may open capsule and sprinkle on pudding         . donepezil (ARICEPT) 10 MG tablet   Oral   Take 10 mg by mouth daily.         Marland Kitchen LORazepam (ATIVAN) 0.5 MG tablet   Oral   Take 0.5 mg by mouth every 6 (six) hours as needed for anxiety (agitation).         . Memantine HCl ER (NAMENDA XR) 7 MG CP24   Oral   Take 1 capsule by mouth daily.         . Multiple Vitamin (DAILY VITE) TABS   Oral   Take 1 tablet by mouth daily.         . vitamin D, CHOLECALCIFEROL, 400 UNITS tablet   Oral   Take 400 Units by mouth daily.  BP 139/76  Pulse 70  Temp(Src) 98.8 F (37.1 C) (Oral)  Resp 20  SpO2 97% Physical Exam  Nursing note and vitals reviewed. Constitutional: She appears well-developed and well-nourished.  HENT:  Head: Normocephalic.  Right Ear: External ear normal.  Left Ear: External ear normal.  Nose: Nose normal.  Mouth/Throat: Oropharynx is clear and moist.  Ecchymosis and swelling to right forehead.  No signs of basilar skull fx on exam, including no hemotympanum, no periorbital ecchymosis, no mastoid ecchymosis.  No septal hematoma.  Midface is stable.  No oropharynx trauma.   Eyes: Conjunctivae and EOM are normal. Pupils are equal, round, and reactive to light.  Neck: Normal range of motion. Neck supple.  Cardiovascular: Normal rate, regular rhythm, normal heart sounds and intact distal pulses.   Pulmonary/Chest: Effort normal and breath sounds normal. No respiratory distress. She has no wheezes. She has no rales. She exhibits no tenderness.  Abdominal: Soft. Bowel sounds are normal. She exhibits no distension and no mass. There is no tenderness. There is no rebound and no guarding.  Musculoskeletal: Normal range of motion. She exhibits no edema and no tenderness.  Neurological: She is alert.  Alert, oriented X 1.  Pt with very poor short  term memory.  No gross sensory or motor deficits.   Skin: Skin is warm and dry.  Scattered bruises on extremities in various stages of healing.  No skin tears noted.     ED Course  Procedures (including critical care time) Labs Review Labs Reviewed  CBC  BASIC METABOLIC PANEL  PROTIME-INR   Imaging Review Ct Head Wo Contrast  10/16/2013   CLINICAL DATA:  Found down, right forehead hematoma, dementia  EXAM: CT HEAD WITHOUT CONTRAST  CT CERVICAL SPINE WITHOUT CONTRAST  TECHNIQUE: Multidetector CT imaging of the head and cervical spine was performed following the standard protocol without intravenous contrast. Multiplanar CT image reconstructions of the cervical spine were also generated.  COMPARISON:  08/10/2013  FINDINGS: CT HEAD FINDINGS  7 x 10 mm focus of parenchymal hemorrhage/contusion in the subcortical left occipital lobe (series 3/ image 12).  No extra-axial fluid collection or intraventricular hemorrhage.  No mass lesion, mass effect, or midline shift.  No CT evidence of acute infarction.  Subcortical white matter and periventricular small vessel ischemic changes. Intracranial atherosclerosis.  Global cortical atrophy.  No ventriculomegaly.  The visualized paranasal sinuses are essentially clear. The mastoid air cells are unopacified.  Mild soft tissue swelling/extracranial hematoma overlying the right frontal bone (series 3/image 16).  No evidence of calvarial fracture.  CT CERVICAL SPINE FINDINGS  Normal cervical lordosis.  No evidence of fracture or dislocation. Vertebral body heights are maintained. Dens appears intact.  No prevertebral soft tissue swelling.  Mild to moderate degenerative changes, most prominent at C5-6.  Visualized thyroid is unremarkable.  Visualized lung apices are notable for paraseptal emphysematous changes with biapical pleural parenchymal scarring.  IMPRESSION: 7 x 10 mm focus of parenchymal hemorrhage/contusion in the subcortical left occipital lobe.  Mild soft  tissue swelling/extracranial hematoma overlying the right frontal bone. No evidence of calvarial fracture.  No evidence of traumatic injury to the cervical spine. Mild-to-moderate degenerative changes.  Critical value/emergent results were called by telephone at the time of interpretation on 10/16/2013 at 11:23 PM to Dr. Linwood DibblesJon Knapp, who verbally acknowledged these results.   Electronically Signed   By: Charline BillsSriyesh  Krishnan M.D.   On: 10/16/2013 23:24   Ct Cervical Spine Wo Contrast  10/16/2013   CLINICAL DATA:  Found down, right forehead hematoma, dementia  EXAM: CT HEAD WITHOUT CONTRAST  CT CERVICAL SPINE WITHOUT CONTRAST  TECHNIQUE: Multidetector CT imaging of the head and cervical spine was performed following the standard protocol without intravenous contrast. Multiplanar CT image reconstructions of the cervical spine were also generated.  COMPARISON:  08/10/2013  FINDINGS: CT HEAD FINDINGS  7 x 10 mm focus of parenchymal hemorrhage/contusion in the subcortical left occipital lobe (series 3/ image 12).  No extra-axial fluid collection or intraventricular hemorrhage.  No mass lesion, mass effect, or midline shift.  No CT evidence of acute infarction.  Subcortical white matter and periventricular small vessel ischemic changes. Intracranial atherosclerosis.  Global cortical atrophy.  No ventriculomegaly.  The visualized paranasal sinuses are essentially clear. The mastoid air cells are unopacified.  Mild soft tissue swelling/extracranial hematoma overlying the right frontal bone (series 3/image 16).  No evidence of calvarial fracture.  CT CERVICAL SPINE FINDINGS  Normal cervical lordosis.  No evidence of fracture or dislocation. Vertebral body heights are maintained. Dens appears intact.  No prevertebral soft tissue swelling.  Mild to moderate degenerative changes, most prominent at C5-6.  Visualized thyroid is unremarkable.  Visualized lung apices are notable for paraseptal emphysematous changes with biapical  pleural parenchymal scarring.  IMPRESSION: 7 x 10 mm focus of parenchymal hemorrhage/contusion in the subcortical left occipital lobe.  Mild soft tissue swelling/extracranial hematoma overlying the right frontal bone. No evidence of calvarial fracture.  No evidence of traumatic injury to the cervical spine. Mild-to-moderate degenerative changes.  Critical value/emergent results were called by telephone at the time of interpretation on 10/16/2013 at 11:23 PM to Dr. Linwood Dibbles, who verbally acknowledged these results.   Electronically Signed   By: Charline Bills M.D.   On: 10/16/2013 23:24   Dg Pelvis Portable  10/16/2013   CLINICAL DATA:  Fall  EXAM: PORTABLE PELVIS 1-2 VIEWS  COMPARISON:  None.  FINDINGS: No fracture or dislocation is seen.  Bilateral hip joint spaces are symmetric.  Visualized bony pelvis appears intact.  Mild degenerative changes of the lower lumbar spine.  IMPRESSION: No fracture or dislocation is seen.   Electronically Signed   By: Charline Bills M.D.   On: 10/16/2013 22:15     EKG Interpretation None      MDM   Final diagnoses:  None   73 yo F hx of dementia, osteoporosis, HTN, CAD, presents via EMS from Assisted Living Facility s/p fall.  Filed Vitals:   10/16/13 2145  BP: 135/76  Pulse: 70  Temp:   Resp: 17   Physical exam as above. Unwitnessed fall.  Pt with dementia, unable to give ROS.  Pt with swelling, and bruising to her forehead.  EKG NSR, no ischemic changes.  CT head, CT cervical spine, XR pelvis, CBC, BMP ordered.   Pelvis XR negative for fx.  CT head demonstrates 7 X 10 mm parenchymal hemorrahge vs contusion left occipital lobe.  CT cervical spine demonstrates degenerative changes, without signs of fx or malalignment.    [facility] consulted, and state this is nonsurgical bleed.  Recommend inpatient stay for observation, repeat CT head on Monday morning, holding ASA for a week.  If bleed is stable on Monday, with no decline in mental status, she may be d/c  in good condition.   Hospitalist called for admission.  Pt and family understand and agree with plan.    I have discussed pt's care plan with Dr. Lynelle Doctor.  Jon Gills, MD  Jon Gills, MD 10/17/13 0110

## 2013-10-17 DIAGNOSIS — F0390 Unspecified dementia without behavioral disturbance: Secondary | ICD-10-CM | POA: Diagnosis present

## 2013-10-17 DIAGNOSIS — I619 Nontraumatic intracerebral hemorrhage, unspecified: Secondary | ICD-10-CM

## 2013-10-17 DIAGNOSIS — I10 Essential (primary) hypertension: Secondary | ICD-10-CM

## 2013-10-17 DIAGNOSIS — M810 Age-related osteoporosis without current pathological fracture: Secondary | ICD-10-CM | POA: Diagnosis present

## 2013-10-17 DIAGNOSIS — I2510 Atherosclerotic heart disease of native coronary artery without angina pectoris: Secondary | ICD-10-CM

## 2013-10-17 LAB — CBC
HCT: 38.9 % (ref 36.0–46.0)
HEMATOCRIT: 38.5 % (ref 36.0–46.0)
Hemoglobin: 13.2 g/dL (ref 12.0–15.0)
Hemoglobin: 12.6 g/dL (ref 12.0–15.0)
MCH: 31.1 pg (ref 26.0–34.0)
MCH: 30 pg (ref 26.0–34.0)
MCHC: 33.9 g/dL (ref 30.0–36.0)
MCHC: 32.7 g/dL (ref 30.0–36.0)
MCV: 91.7 fL (ref 78.0–100.0)
MCV: 91.7 fL (ref 78.0–100.0)
PLATELETS: 158 10*3/uL (ref 150–400)
PLATELETS: 146 10*3/uL — AB (ref 150–400)
RBC: 4.24 MIL/uL (ref 3.87–5.11)
RBC: 4.2 MIL/uL (ref 3.87–5.11)
RDW: 13.8 % (ref 11.5–15.5)
RDW: 13.8 % (ref 11.5–15.5)
WBC: 5.5 10*3/uL (ref 4.0–10.5)
WBC: 5 10*3/uL (ref 4.0–10.5)

## 2013-10-17 LAB — BASIC METABOLIC PANEL
BUN: 25 mg/dL — ABNORMAL HIGH (ref 6–23)
BUN: 20 mg/dL (ref 6–23)
CALCIUM: 8.9 mg/dL (ref 8.4–10.5)
CHLORIDE: 106 meq/L (ref 96–112)
CHLORIDE: 109 meq/L (ref 96–112)
CO2: 24 mEq/L (ref 19–32)
CO2: 25 meq/L (ref 19–32)
CREATININE: 0.94 mg/dL (ref 0.50–1.10)
CREATININE: 0.9 mg/dL (ref 0.50–1.10)
Calcium: 9.4 mg/dL (ref 8.4–10.5)
GFR calc Af Amer: 73 mL/min — ABNORMAL LOW (ref >90)
GFR calc non Af Amer: 60 mL/min — ABNORMAL LOW (ref >90)
GFR calc non Af Amer: 63 mL/min — ABNORMAL LOW (ref >90)
GFR, EST AFRICAN AMERICAN: 70 mL/min — AB (ref >90)
Glucose, Bld: 99 mg/dL (ref 70–99)
Glucose, Bld: 87 mg/dL (ref 70–99)
Potassium: 4.4 mEq/L (ref 3.7–5.3)
Potassium: 4 mEq/L (ref 3.7–5.3)
Sodium: 144 mEq/L (ref 137–147)
Sodium: 145 mEq/L (ref 137–147)

## 2013-10-17 MED ORDER — SODIUM CHLORIDE 0.9 % IJ SOLN
3.0000 mL | Freq: Two times a day (BID) | INTRAMUSCULAR | Status: DC
  Administered 2013-10-17 – 2013-10-18 (×2): 3 mL via INTRAVENOUS

## 2013-10-17 MED ORDER — ALUM & MAG HYDROXIDE-SIMETH 200-200-20 MG/5ML PO SUSP: 30.0000 mL | Freq: Four times a day (QID) | ORAL | Status: DC | PRN

## 2013-10-17 MED ORDER — BUPROPION HCL ER (SR) 150 MG PO TB12
150.0000 mg | ORAL_TABLET | Freq: Every morning | ORAL | Status: DC
  Administered 2013-10-17 – 2013-10-19 (×3): 150 mg via ORAL
  Filled 2013-10-17 (×3): qty 1

## 2013-10-17 MED ORDER — CHLORHEXIDINE GLUCONATE 0.12 % MT SOLN
15.0000 mL | Freq: Two times a day (BID) | OROMUCOSAL | Status: DC
  Administered 2013-10-17 – 2013-10-18 (×4): 15 mL via OROMUCOSAL
  Filled 2013-10-17 (×7): qty 15

## 2013-10-17 MED ORDER — SODIUM CHLORIDE 0.9 % IV SOLN
INTRAVENOUS | Status: DC
  Administered 2013-10-17: 02:00:00 via INTRAVENOUS

## 2013-10-17 MED ORDER — HYDROMORPHONE HCL PF 1 MG/ML IJ SOLN: 0.5000 mg | INTRAMUSCULAR | Status: DC | PRN

## 2013-10-17 MED ORDER — ACETAMINOPHEN 325 MG PO TABS: 650.0000 mg | ORAL_TABLET | Freq: Four times a day (QID) | ORAL | Status: DC | PRN

## 2013-10-17 MED ORDER — CHOLECALCIFEROL 10 MCG (400 UNIT) PO TABS
400.0000 [IU] | ORAL_TABLET | Freq: Every day | ORAL | Status: DC
  Administered 2013-10-17 – 2013-10-19 (×3): 400 [IU] via ORAL
  Filled 2013-10-17 (×3): qty 1

## 2013-10-17 MED ORDER — MEMANTINE HCL ER 7 MG PO CP24
1.0000 | ORAL_CAPSULE | Freq: Every day | ORAL | Status: DC
  Administered 2013-10-17 – 2013-10-19 (×2): 7 mg via ORAL
  Filled 2013-10-17 (×3): qty 1

## 2013-10-17 MED ORDER — ADULT MULTIVITAMIN W/MINERALS CH
1.0000 | ORAL_TABLET | Freq: Every day | ORAL | Status: DC
  Administered 2013-10-17 – 2013-10-19 (×3): 1 via ORAL
  Filled 2013-10-17 (×4): qty 1

## 2013-10-17 MED ORDER — ONDANSETRON HCL 4 MG PO TABS: 4.0000 mg | ORAL_TABLET | Freq: Four times a day (QID) | ORAL | Status: DC | PRN

## 2013-10-17 MED ORDER — ACETAMINOPHEN 650 MG RE SUPP: 650.0000 mg | Freq: Four times a day (QID) | RECTAL | Status: DC | PRN

## 2013-10-17 MED ORDER — LORAZEPAM 0.5 MG PO TABS: 0.5000 mg | ORAL_TABLET | Freq: Four times a day (QID) | ORAL | Status: DC | PRN

## 2013-10-17 MED ORDER — OXYCODONE HCL 5 MG PO TABS
5.0000 mg | ORAL_TABLET | ORAL | Status: DC | PRN
Start: 2013-10-17 — End: 2013-10-19

## 2013-10-17 MED ORDER — DIVALPROEX SODIUM 125 MG PO CPSP
250.0000 mg | ORAL_CAPSULE | Freq: Three times a day (TID) | ORAL | Status: DC
  Administered 2013-10-17 – 2013-10-19 (×9): 250 mg via ORAL
  Filled 2013-10-17 (×11): qty 2

## 2013-10-17 MED ORDER — ONDANSETRON HCL 4 MG/2ML IJ SOLN: 4.0000 mg | Freq: Four times a day (QID) | INTRAMUSCULAR | Status: DC | PRN

## 2013-10-17 MED ORDER — DONEPEZIL HCL 10 MG PO TABS
10.0000 mg | ORAL_TABLET | Freq: Every day | ORAL | Status: DC
  Administered 2013-10-17 – 2013-10-19 (×3): 10 mg via ORAL
  Filled 2013-10-17 (×3): qty 1

## 2013-10-17 NOTE — H&P (Signed)
[facility] History and Physical  Alexa Knox NWG:[id number] DOB: Oct 03, 1940 DOA: 10/16/2013  Referring physician:  PCP: [name], MD  Specialists:   Chief Complaint:   HPI: Alexa Knox is a 73 y.o. female resident of the [facility] who was found on the floor in her room by staff and sent to the ED for evaluation.  She has Dementia and can not give a history.   She was evaluated in the ED and had a CT scan performed which revealed a left occipital lobe parenchymal Hemorrhage/Contusion.   The EDP contacted Neurosurgery who gave recommendations for observation and repeat ct scan of the brain in 48 hours and per neurosurgery the condition is non-surgical at this time.   She was referred for medical admission.      Review of Systems: Unable to Obtain from the Patient  Past Medical History  Diagnosis Date  . Osteoporosis   . Dementia   . Depression   . Hypertension   . Malnutrition   . Coronary artery disease       History reviewed. No pertinent past surgical history.     Prior to Admission medications   Medication Sig Start Date End Date Taking? Authorizing Provider  alendronate (FOSAMAX) 70 MG tablet Take 70 mg by mouth once a week. Take with a full glass of water on an empty stomach. Patient takes on Sat.   Yes Historical Provider, MD  aspirin 81 MG tablet Take 81 mg by mouth daily.   Yes Historical Provider, MD  buPROPion (WELLBUTRIN SR) 150 MG 12 hr tablet Take 150 mg by mouth every morning.   Yes Historical Provider, MD  chlorhexidine (PERIDEX) 0.12 % solution Use as directed 15 mLs in the mouth or throat 2 (two) times daily.   Yes Historical Provider, MD  divalproex (DEPAKOTE SPRINKLE) 125 MG capsule Take 250 mg by mouth 3 (three) times daily. With meals, may open capsule and sprinkle on pudding   Yes Historical Provider, MD  donepezil (ARICEPT) 10 MG tablet Take 10 mg by mouth daily.   Yes Historical Provider, MD  Memantine  HCl ER (NAMENDA XR) 7 MG CP24 Take 1 capsule by mouth daily.   Yes Historical Provider, MD  Multiple Vitamin (DAILY VITE) TABS Take 1 tablet by mouth daily.   Yes Historical Provider, MD  vitamin D, CHOLECALCIFEROL, 400 UNITS tablet Take 400 Units by mouth daily.   Yes Historical Provider, MD  LORazepam (ATIVAN) 0.5 MG tablet Take 0.5 mg by mouth every 6 (six) hours as needed for anxiety (agitation).    Historical Provider, MD      No Known Allergies   Social History:  reports that she has never smoked. She does not have any smokeless tobacco history on file. She reports that she does not drink alcohol or use illicit drugs.     No family history on file.     Physical Exam:  GEN:  Pleasant  Well Nourished and Well Developed Elderly  73 y.o. Caucasian female  examined  and in no acute distress; cooperative with exam Filed Vitals:   10/16/13 2330 10/17/13 0000 10/17/13 0057 10/17/13 0200  BP:  125/67 132/62 144/93  Pulse: 67 62 61 64  Temp:    98.8 F (37.1 C)  TempSrc:    Oral  Resp: 18 18 18 18   Height:    5\' 7"  (1.702 m)  Weight:    59.3 kg (130 lb 11.7 oz)  SpO2: 98% 98%  99% 100%   Blood pressure 144/93, pulse 64, temperature 98.8 F (37.1 C), temperature source Oral, resp. rate 18, height 5\' 7"  (1.702 m), weight 59.3 kg (130 lb 11.7 oz), SpO2 100.00%. PSYCH: SHe is alert and oriented x1; does not appear anxious does not appear depressed; affect is normal HEENT: Normocephalic and Atraumatic, Mucous membranes pink; PERRLA; EOM intact; Fundi:  Benign;  No scleral icterus, Nares: Patent, Oropharynx: Clear, Fair Dentition, Neck:  FROM, no cervical lymphadenopathy nor thyromegaly or carotid bruit; no JVD; Breasts:: Not examined CHEST WALL: No tenderness CHEST: Normal respiration, clear to auscultation bilaterally HEART: Regular rate and rhythm; no murmurs rubs or gallops BACK: No kyphosis or scoliosis; no CVA tenderness ABDOMEN: Positive Bowel Sounds, soft non-tender; no masses,  no organomegaly, no pannus; no intertriginous candida. Rectal Exam: Not done EXTREMITIES: No cyanosis, clubbing or edema; no ulcerations. Genitalia: not examined PULSES: 2+ and symmetric SKIN: Normal hydration no rash or ulceration CNS:  Alert and Oriented x 1, Speech is Clear and Fluent,  Mental Status:  Alert, oriented, thought content appropriate. Speech fluent without evidence of aphasia. Able to follow 3 step commands without difficulty. In No obvious pain.  Cranial Nerves:  II: Discs flat bilaterally; Visual fields Intact,  or Decreased peripheral vision to the left or right. Pupils equal and reactive.  III,IV, VI: right ptosis, extra-ocular motions intact bilaterally  V,VII: smile symmetric, facial light touch sensation normal bilaterally  VIII: hearing decreasesd bilaterally  IX,X: gag reflex present  XI: bilateral shoulder shrug  XII: midline tongue extension  Motor:  Right : Upper extremity 5/5 Left: Upper extremity 5/5  Lower extremity 5/5 Lower extremity 5/5  Tone and bulk:normal tone throughout; no atrophy noted  Sensory: Pinprick and light touch intact throughout, bilaterally  Deep Tendon Reflexes: 2+ and symmetric throughout    Cerebellar: Finger to nose with or without difficulty.  Gait: deferred  Vascular: pulses palpable throughout    Labs on Admission:  Basic Metabolic Panel:  Recent Labs Lab 10/16/13 2232  NA 144  K 4.4  CL 106  CO2 24  GLUCOSE 99  BUN 25*  CREATININE 0.94  CALCIUM 9.4   Liver Function Tests: No results found for this basename: AST, ALT, ALKPHOS, BILITOT, PROT, ALBUMIN,  in the last 168 hours No results found for this basename: LIPASE, AMYLASE,  in the last 168 hours No results found for this basename: AMMONIA,  in the last 168 hours CBC:  Recent Labs Lab 10/16/13 2232  WBC 5.5  HGB 13.2  HCT 38.9  MCV 91.7  PLT 158   Cardiac Enzymes: No results found for this basename: CKTOTAL, CKMB, CKMBINDEX, TROPONINI,  in the last  168 hours  BNP (last 3 results) No results found for this basename: PROBNP,  in the last 8760 hours CBG: No results found for this basename: GLUCAP,  in the last 168 hours  Radiological Exams on Admission: Ct Head Wo Contrast  10/16/2013   CLINICAL DATA:  Found down, right forehead hematoma, dementia  EXAM: CT HEAD WITHOUT CONTRAST  CT CERVICAL SPINE WITHOUT CONTRAST  TECHNIQUE: Multidetector CT imaging of the head and cervical spine was performed following the standard protocol without intravenous contrast. Multiplanar CT image reconstructions of the cervical spine were also generated.  COMPARISON:  08/10/2013  FINDINGS: CT HEAD FINDINGS  7 x 10 mm focus of parenchymal hemorrhage/contusion in the subcortical left occipital lobe (series 3/ image 12).  No extra-axial fluid collection or intraventricular hemorrhage.  No mass lesion, mass  effect, or midline shift.  No CT evidence of acute infarction.  Subcortical white matter and periventricular small vessel ischemic changes. Intracranial atherosclerosis.  Global cortical atrophy.  No ventriculomegaly.  The visualized paranasal sinuses are essentially clear. The mastoid air cells are unopacified.  Mild soft tissue swelling/extracranial hematoma overlying the right frontal bone (series 3/image 16).  No evidence of calvarial fracture.  CT CERVICAL SPINE FINDINGS  Normal cervical lordosis.  No evidence of fracture or dislocation. Vertebral body heights are maintained. Dens appears intact.  No prevertebral soft tissue swelling.  Mild to moderate degenerative changes, most prominent at C5-6.  Visualized thyroid is unremarkable.  Visualized lung apices are notable for paraseptal emphysematous changes with biapical pleural parenchymal scarring.  IMPRESSION: 7 x 10 mm focus of parenchymal hemorrhage/contusion in the subcortical left occipital lobe.  Mild soft tissue swelling/extracranial hematoma overlying the right frontal bone. No evidence of calvarial fracture.  No  evidence of traumatic injury to the cervical spine. Mild-to-moderate degenerative changes.  Critical value/emergent results were called by telephone at the time of interpretation on 10/16/2013 at 11:23 PM to Dr. Linwood DibblesJon Knapp, who verbally acknowledged these results.   Electronically Signed   By: Charline BillsSriyesh  Krishnan M.D.   On: 10/16/2013 23:24   Ct Cervical Spine Wo Contrast  10/16/2013   CLINICAL DATA:  Found down, right forehead hematoma, dementia  EXAM: CT HEAD WITHOUT CONTRAST  CT CERVICAL SPINE WITHOUT CONTRAST  TECHNIQUE: Multidetector CT imaging of the head and cervical spine was performed following the standard protocol without intravenous contrast. Multiplanar CT image reconstructions of the cervical spine were also generated.  COMPARISON:  08/10/2013  FINDINGS: CT HEAD FINDINGS  7 x 10 mm focus of parenchymal hemorrhage/contusion in the subcortical left occipital lobe (series 3/ image 12).  No extra-axial fluid collection or intraventricular hemorrhage.  No mass lesion, mass effect, or midline shift.  No CT evidence of acute infarction.  Subcortical white matter and periventricular small vessel ischemic changes. Intracranial atherosclerosis.  Global cortical atrophy.  No ventriculomegaly.  The visualized paranasal sinuses are essentially clear. The mastoid air cells are unopacified.  Mild soft tissue swelling/extracranial hematoma overlying the right frontal bone (series 3/image 16).  No evidence of calvarial fracture.  CT CERVICAL SPINE FINDINGS  Normal cervical lordosis.  No evidence of fracture or dislocation. Vertebral body heights are maintained. Dens appears intact.  No prevertebral soft tissue swelling.  Mild to moderate degenerative changes, most prominent at C5-6.  Visualized thyroid is unremarkable.  Visualized lung apices are notable for paraseptal emphysematous changes with biapical pleural parenchymal scarring.  IMPRESSION: 7 x 10 mm focus of parenchymal hemorrhage/contusion in the subcortical  left occipital lobe.  Mild soft tissue swelling/extracranial hematoma overlying the right frontal bone. No evidence of calvarial fracture.  No evidence of traumatic injury to the cervical spine. Mild-to-moderate degenerative changes.  Critical value/emergent results were called by telephone at the time of interpretation on 10/16/2013 at 11:23 PM to Dr. Linwood DibblesJon Knapp, who verbally acknowledged these results.   Electronically Signed   By: Charline BillsSriyesh  Krishnan M.D.   On: 10/16/2013 23:24   Dg Pelvis Portable  10/16/2013   CLINICAL DATA:  Fall  EXAM: PORTABLE PELVIS 1-2 VIEWS  COMPARISON:  None.  FINDINGS: No fracture or dislocation is seen.  Bilateral hip joint spaces are symmetric.  Visualized bony pelvis appears intact.  Mild degenerative changes of the lower lumbar spine.  IMPRESSION: No fracture or dislocation is seen.   Electronically Signed   By: Lurlean HornsSriyesh  Rito Ehrlich M.D.   On: 10/16/2013 22:15       Assessment/Plan:   73 y.o. female with  Principal Problem:   Cerebral parenchymal hemorrhage Active Problems:   Coronary artery disease   Hypertension   Osteoporosis   Dementia     1.   Cerebral Parenchymal Hemorrhage/Contusion-  Monitor for neurologic changes,  Repeat CT scan of Brain on Monday AM.     2.   CAD- stable  3.  HTN-  Not on meds currently, monitor BPs.    4.   Osteoporosis-  On weekly Fosamax Rx.    5.   Dementia- continue Aricept, and Namenda RX, and Divalproex.    6.  SCDs for DVT prophylaxis.       Code Status:    DO NOT RESUSCITATE (DNR) Family Communication:    Family at Bedside Disposition Plan:       Inpatient  Time spent:   63 Minutes  Ron Parker [facility] Pager [phone]  If 7PM-7AM, please contact night-coverage [email or web address] Password [email or web address] 10/17/2013, 2:57 AM

## 2013-10-17 NOTE — Evaluation (Addendum)
Physical Therapy Evaluation Patient Details Name: GER NICKS MRN: [id number] DOB: [birth year] Today's Date: 10/17/2013 Time: 7829-5621 PT Time Calculation (min): 29 min  PT Assessment / Plan / Recommendation History of Present Illness   73 y.o. female resident of the [facility] who was found on the floor in her room by staff and sent to the ED for evaluation.  She has Dementia and can not give a history.   She was evaluated in the ED and had a CT scan performed which revealed a left occipital lobe parenchymal Hemorrhage/Contusion.   The EDP contacted Neurosurgery who gave recommendations for observation and repeat ct scan of the brain in 48 hours and per neurosurgery the condition is non-surgical at this time.   She was referred for medical admission.     Clinical Impression  Patient with severe dementia and attempts to ambulate without assistance.  Patient with decreased balance, shuffle gait, decreased gait velocity and at fall risk.  Recommend supervision for all mobility, however, patient unable to recall the need for supervision and will get up on her own.  This is patient's baseline and do not feel further PT is warranted.  Will sign off. Do not feel patient will benefit from OT services as she has assistance with ADL's at ALF and would not retain any teaching provided.    PT Assessment  Patent does not need any further PT services    Follow Up Recommendations  No PT follow up    Does the patient have the potential to tolerate intense rehabilitation      Barriers to Discharge        Equipment Recommendations  None recommended by PT    Recommendations for Other Services     Frequency      Precautions / Restrictions Precautions Precautions: Fall   Pertinent Vitals/Pain Denies pain      Mobility  Bed Mobility Overal bed mobility: Modified Independent Transfers Overall transfer level: Modified independent General transfer comment: safety  concerns - fall risk Ambulation/Gait Ambulation/Gait assistance: Min guard Ambulation Distance (Feet): 100 Feet Assistive device: 1 person hand held assist Gait Pattern/deviations: Shuffle Gait velocity interpretation: <1.8 ft/sec, indicative of risk for recurrent falls    Exercises     PT Diagnosis:    PT Problem List:   PT Treatment Interventions:       PT Goals(Current goals can be found in the care plan section) Acute Rehab PT Goals PT Goal Formulation: No goals set, d/c therapy  Visit Information  Last PT Received On: 10/17/13 Assistance Needed: +1 History of Present Illness:  73 y.o. female resident of the [facility] who was found on the floor in her room by staff and sent to the ED for evaluation.  She has Dementia and can not give a history.   She was evaluated in the ED and had a CT scan performed which revealed a left occipital lobe parenchymal Hemorrhage/Contusion.   The EDP contacted Neurosurgery who gave recommendations for observation and repeat ct scan of the brain in 48 hours and per neurosurgery the condition is non-surgical at this time.   She was referred for medical admission.          Prior Functioning  Home Living Family/patient expects to be discharged to:: Skilled nursing facility Prior Function Level of Independence: Needs assistance Gait / Transfers Assistance Needed: ambulated independently but a high fall risk and fell frequently    Cognition  Cognition Arousal/Alertness: Awake/alert  Behavior During Therapy: [facility] ServicesWFL for tasks assessed/performed;Impulsive Overall Cognitive Status: History of cognitive impairments - at baseline    Extremity/Trunk Assessment Upper Extremity Assessment Upper Extremity Assessment: Overall WFL for tasks assessed Lower Extremity Assessment Lower Extremity Assessment: Overall WFL for tasks assessed   Balance Balance Overall balance assessment: Needs assistance Sitting balance-Leahy Scale:  Good Standing balance-Leahy Scale: Fair General Comments General comments (skin integrity, edema, etc.): attempted to leave patient in chair with chair alarm, however patient continued to get up on her own.  therefore patient returned to bed with bed alarm on.  End of Session PT - End of Session Activity Tolerance: Patient tolerated treatment well Patient left: in bed;with bed alarm set;with nursing/sitter in room;with call bell/phone within reach Nurse Communication: Mobility status  GP     Olivia CanterMoton, Keller Bounds M, [place] CarolinaPT [phone] 10/17/2013, 2:53 PM

## 2013-10-17 NOTE — Progress Notes (Signed)
OT Cancellation Note  Patient Details Name: Alexa QuamCharlotte E Knox MRN: [id number] DOB: [birth year]   Cancelled Treatment:    Reason Eval/Treat Not Completed: OT screened, no needs identified, will sign off  Earlie RavelingStraub, Vaughn Frieze L OTR/L [phone] 10/17/2013, 4:24 PM

## 2013-10-17 NOTE — Progress Notes (Signed)
[facility] PROGRESS NOTE   Alexa QuamCharlotte E Knox MVH:[id number] DOB: Apr 11, 1941 DOA: 10/16/2013 PCP: Florentina JennyRIPP, HENRY, MD  HPI/Subjective: Pleasantly confused, no complaints.  Assessment/Plan: Principal Problem:   Cerebral parenchymal hemorrhage Active Problems:   Coronary artery disease   Hypertension   Osteoporosis   Dementia   Cerebral parenchymal hemorrhage/contusion -Status post fall, monitor for neurologic changes. -Patient is on neurology floor, there checks every 2 hours for the first 24 hours. -Per neurosurgical for consultation, repeat CT scan on Monday. -No other evidence of fractures or abnormalities.  Dementia -Patient is on restricted and Namenda she is also on divalproex. -He is pleasantly confused but awake.  HTN/CAD -Stable no complaints.  Fall -Unwitnessed fall, she was found on the floor by the assisted living staff. -Has frontal bruising. -PT/OT, she might need higher level of care and discharge  Code Status: DO NOT RESUSCITATE Family Communication: Plan discussed with the patient. Disposition Plan: Remains inpatient   Consultants:  None  Procedures:  None  Antibiotics:  None   Objective: Filed Vitals:   10/17/13 1023  BP: 123/82  Pulse: 61  Temp: 97.5 F (36.4 C)  Resp: 18    Intake/Output Summary (Last 24 hours) at 10/17/13 1125 Last data filed at 10/17/13 0859  Gross per 24 hour  Intake    180 ml  Output      0 ml  Net    180 ml   Filed Weights   10/17/13 0200  Weight: 59.3 kg (130 lb 11.7 oz)    Exam: General: Alert and awake, oriented x3, not in any acute distress. HEENT: anicteric sclera, pupils reactive to light and accommodation, EOMI CVS: S1-S2 clear, no murmur rubs or gallops Chest: clear to auscultation bilaterally, no wheezing, rales or rhonchi Abdomen: soft nontender, nondistended, normal bowel sounds, no organomegaly Extremities: no cyanosis, clubbing or edema noted bilaterally Neuro: Cranial nerves  II-XII intact, no focal neurological deficits  Data Reviewed: Basic Metabolic Panel:  Recent Labs Lab 10/16/13 2232 10/17/13 0638  NA 144 145  K 4.4 4.0  CL 106 109  CO2 24 25  GLUCOSE 99 87  BUN 25* 20  CREATININE 0.94 0.90  CALCIUM 9.4 8.9   Liver Function Tests: No results found for this basename: AST, ALT, ALKPHOS, BILITOT, PROT, ALBUMIN,  in the last 168 hours No results found for this basename: LIPASE, AMYLASE,  in the last 168 hours No results found for this basename: AMMONIA,  in the last 168 hours CBC:  Recent Labs Lab 10/16/13 2232 10/17/13 0638  WBC 5.5 5.0  HGB 13.2 12.6  HCT 38.9 38.5  MCV 91.7 91.7  PLT 158 146*   Cardiac Enzymes: No results found for this basename: CKTOTAL, CKMB, CKMBINDEX, TROPONINI,  in the last 168 hours BNP (last 3 results) No results found for this basename: PROBNP,  in the last 8760 hours CBG: No results found for this basename: GLUCAP,  in the last 168 hours  Micro No results found for this or any previous visit (from the past 240 hour(s)).   Studies: Ct Head Wo Contrast  10/16/2013   CLINICAL DATA:  Found down, right forehead hematoma, dementia  EXAM: CT HEAD WITHOUT CONTRAST  CT CERVICAL SPINE WITHOUT CONTRAST  TECHNIQUE: Multidetector CT imaging of the head and cervical spine was performed following the standard protocol without intravenous contrast. Multiplanar CT image reconstructions of the cervical spine were also generated.  COMPARISON:  08/10/2013  FINDINGS: CT HEAD FINDINGS  7 x 10 mm focus of  parenchymal hemorrhage/contusion in the subcortical left occipital lobe (series 3/ image 12).  No extra-axial fluid collection or intraventricular hemorrhage.  No mass lesion, mass effect, or midline shift.  No CT evidence of acute infarction.  Subcortical white matter and periventricular small vessel ischemic changes. Intracranial atherosclerosis.  Global cortical atrophy.  No ventriculomegaly.  The visualized paranasal sinuses are  essentially clear. The mastoid air cells are unopacified.  Mild soft tissue swelling/extracranial hematoma overlying the right frontal bone (series 3/image 16).  No evidence of calvarial fracture.  CT CERVICAL SPINE FINDINGS  Normal cervical lordosis.  No evidence of fracture or dislocation. Vertebral body heights are maintained. Dens appears intact.  No prevertebral soft tissue swelling.  Mild to moderate degenerative changes, most prominent at C5-6.  Visualized thyroid is unremarkable.  Visualized lung apices are notable for paraseptal emphysematous changes with biapical pleural parenchymal scarring.  IMPRESSION: 7 x 10 mm focus of parenchymal hemorrhage/contusion in the subcortical left occipital lobe.  Mild soft tissue swelling/extracranial hematoma overlying the right frontal bone. No evidence of calvarial fracture.  No evidence of traumatic injury to the cervical spine. Mild-to-moderate degenerative changes.  Critical value/emergent results were called by telephone at the time of interpretation on 10/16/2013 at 11:23 PM to Dr. Linwood Dibbles, who verbally acknowledged these results.   Electronically Signed   By: Charline Bills M.D.   On: 10/16/2013 23:24   Ct Cervical Spine Wo Contrast  10/16/2013   CLINICAL DATA:  Found down, right forehead hematoma, dementia  EXAM: CT HEAD WITHOUT CONTRAST  CT CERVICAL SPINE WITHOUT CONTRAST  TECHNIQUE: Multidetector CT imaging of the head and cervical spine was performed following the standard protocol without intravenous contrast. Multiplanar CT image reconstructions of the cervical spine were also generated.  COMPARISON:  08/10/2013  FINDINGS: CT HEAD FINDINGS  7 x 10 mm focus of parenchymal hemorrhage/contusion in the subcortical left occipital lobe (series 3/ image 12).  No extra-axial fluid collection or intraventricular hemorrhage.  No mass lesion, mass effect, or midline shift.  No CT evidence of acute infarction.  Subcortical white matter and periventricular small  vessel ischemic changes. Intracranial atherosclerosis.  Global cortical atrophy.  No ventriculomegaly.  The visualized paranasal sinuses are essentially clear. The mastoid air cells are unopacified.  Mild soft tissue swelling/extracranial hematoma overlying the right frontal bone (series 3/image 16).  No evidence of calvarial fracture.  CT CERVICAL SPINE FINDINGS  Normal cervical lordosis.  No evidence of fracture or dislocation. Vertebral body heights are maintained. Dens appears intact.  No prevertebral soft tissue swelling.  Mild to moderate degenerative changes, most prominent at C5-6.  Visualized thyroid is unremarkable.  Visualized lung apices are notable for paraseptal emphysematous changes with biapical pleural parenchymal scarring.  IMPRESSION: 7 x 10 mm focus of parenchymal hemorrhage/contusion in the subcortical left occipital lobe.  Mild soft tissue swelling/extracranial hematoma overlying the right frontal bone. No evidence of calvarial fracture.  No evidence of traumatic injury to the cervical spine. Mild-to-moderate degenerative changes.  Critical value/emergent results were called by telephone at the time of interpretation on 10/16/2013 at 11:23 PM to Dr. Linwood Dibbles, who verbally acknowledged these results.   Electronically Signed   By: Charline Bills M.D.   On: 10/16/2013 23:24   Dg Pelvis Portable  10/16/2013   CLINICAL DATA:  Fall  EXAM: PORTABLE PELVIS 1-2 VIEWS  COMPARISON:  None.  FINDINGS: No fracture or dislocation is seen.  Bilateral hip joint spaces are symmetric.  Visualized bony pelvis appears intact.  Mild degenerative changes of the lower lumbar spine.  IMPRESSION: No fracture or dislocation is seen.   Electronically Signed   By: Charline Bills M.D.   On: 10/16/2013 22:15    Scheduled Meds: . buPROPion  150 mg Oral q morning - 10a  . chlorhexidine  15 mL Mouth/Throat BID  . cholecalciferol  400 Units Oral Daily  . divalproex  250 mg Oral TID WC  . donepezil  10 mg Oral  Daily  . Memantine HCl ER  1 capsule Oral Daily  . multivitamin with minerals  1 tablet Oral Daily  . sodium chloride  3 mL Intravenous Q12H   Continuous Infusions: . sodium chloride 75 mL/hr at 10/17/13 0227       Time spent: 35 minutes    [facility]  [facility] Pager [phone] If 7PM-7AM, please contact night-coverage at [email or web address], password [facility] 10/17/2013, 11:25 AM  LOS: 1 day

## 2013-10-18 ENCOUNTER — Inpatient Hospital Stay (HOSPITAL_COMMUNITY): Payer: Medicare Other

## 2013-10-18 NOTE — Clinical Social Work Note (Signed)
Contacted [facility] to update and determine if they can accept her back at dc or if she would need to pursue SNF- awaiting callback. Do not anticipate SNF need per PT eval and recommendations- full assessment to follow- Reece LevyJanet Davit Vassar, MSW, Theresia MajorsLCSWA [phone]

## 2013-10-18 NOTE — Progress Notes (Signed)
UR complete.  Corley Maffeo RN, MSN 

## 2013-10-18 NOTE — Progress Notes (Signed)
[facility] PROGRESS NOTE  Assessment/Plan: Cerebral parenchymal hemorrhage/contusion: -Status post fall, monitor for neurologic changes.  -Per neurosurgical for consultation, repeat CT scan: stable from previous. -No other evidence of fractures or abnormalities.  - home in am.  Dementia: -Patient is on restricted and Namenda she is also on divalproex.  -He is pleasantly confused but awake.   HTN/CAD  -Stable no complaints.   Fall  -Unwitnessed fall, she was found on the floor by the assisted living staff.  -Has frontal bruising.  -PT/OT, she might need higher level of care and discharge    Code Status: DO NOT RESUSCITATE  Family Communication: Plan discussed with the patient.  Disposition Plan: Remains inpatient    Consultants:  Neurosurgery  Procedures:  Ct head  Antibiotics:  None  HPI/Subjective: No complains  Objective: Filed Vitals:   10/17/13 2031 10/18/13 0208 10/18/13 0614 10/18/13 0952  BP: 126/71 136/64 140/61 141/67  Pulse: 69 72 71 71  Temp: 98.5 F (36.9 C) 99.5 F (37.5 C) 98.2 F (36.8 C) 98.1 F (36.7 C)  TempSrc: Oral Oral Oral Oral  Resp: 18 16 16 18   Height:      Weight:      SpO2: 98% 98% 98% 94%    Intake/Output Summary (Last 24 hours) at 10/18/13 1204 Last data filed at 10/18/13 0900  Gross per 24 hour  Intake    240 ml  Output      0 ml  Net    240 ml   Filed Weights   10/17/13 0200  Weight: 59.3 kg (130 lb 11.7 oz)    Exam:  General: Alert, awake, oriented x3, in no acute distress.  HEENT: No bruits, no goiter.  Heart: Regular rate and rhythm, without murmurs, rubs, gallops.  Lungs: Good air movement, bilateral air movement.  Abdomen: Soft, nontender, nondistended, positive bowel sounds.  Neuro: Grossly intact, nonfocal.   Data Reviewed: Basic Metabolic Panel:  Recent Labs Lab 10/16/13 2232 10/17/13 0638  NA 144 145  K 4.4 4.0  CL 106 109  CO2 24 25  GLUCOSE 99 87  BUN 25* 20    CREATININE 0.94 0.90  CALCIUM 9.4 8.9   Liver Function Tests: No results found for this basename: AST, ALT, ALKPHOS, BILITOT, PROT, ALBUMIN,  in the last 168 hours No results found for this basename: LIPASE, AMYLASE,  in the last 168 hours No results found for this basename: AMMONIA,  in the last 168 hours CBC:  Recent Labs Lab 10/16/13 2232 10/17/13 0638  WBC 5.5 5.0  HGB 13.2 12.6  HCT 38.9 38.5  MCV 91.7 91.7  PLT 158 146*   Cardiac Enzymes: No results found for this basename: CKTOTAL, CKMB, CKMBINDEX, TROPONINI,  in the last 168 hours BNP (last 3 results) No results found for this basename: PROBNP,  in the last 8760 hours CBG: No results found for this basename: GLUCAP,  in the last 168 hours  No results found for this or any previous visit (from the past 240 hour(s)).   Studies: Ct Head Wo Contrast  10/18/2013   CLINICAL DATA:  Parenchymal hemorrhage.  EXAM: CT HEAD WITHOUT CONTRAST  TECHNIQUE: Contiguous axial images were obtained from the base of the skull through the vertex without intravenous contrast.  COMPARISON:  10/16/2013  FINDINGS: No acute osseous findings. No significant change in a small right frontal scalp contusion. The imaged paranasal sinuses remain clear.  Size stable 10 mm parenchymal hematoma in the inferior left occipital lobe. There  is no significant mass effect. No new intracranial hemorrhage. No evidence of acute ischemia or hydrocephalus.  Senescent changes include generalized cerebral volume loss and moderately advanced chronic small vessel ischemic injury, with diffuse patchy cerebral white matter low-attenuation.  IMPRESSION: Size stable 1 cm hematoma in the left occipital lobe.   Electronically Signed   By: Tiburcio Pea M.D.   On: 10/18/2013 07:00   Ct Head Wo Contrast  10/16/2013   CLINICAL DATA:  Found down, right forehead hematoma, dementia  EXAM: CT HEAD WITHOUT CONTRAST  CT CERVICAL SPINE WITHOUT CONTRAST  TECHNIQUE: Multidetector CT imaging  of the head and cervical spine was performed following the standard protocol without intravenous contrast. Multiplanar CT image reconstructions of the cervical spine were also generated.  COMPARISON:  08/10/2013  FINDINGS: CT HEAD FINDINGS  7 x 10 mm focus of parenchymal hemorrhage/contusion in the subcortical left occipital lobe (series 3/ image 12).  No extra-axial fluid collection or intraventricular hemorrhage.  No mass lesion, mass effect, or midline shift.  No CT evidence of acute infarction.  Subcortical white matter and periventricular small vessel ischemic changes. Intracranial atherosclerosis.  Global cortical atrophy.  No ventriculomegaly.  The visualized paranasal sinuses are essentially clear. The mastoid air cells are unopacified.  Mild soft tissue swelling/extracranial hematoma overlying the right frontal bone (series 3/image 16).  No evidence of calvarial fracture.  CT CERVICAL SPINE FINDINGS  Normal cervical lordosis.  No evidence of fracture or dislocation. Vertebral body heights are maintained. Dens appears intact.  No prevertebral soft tissue swelling.  Mild to moderate degenerative changes, most prominent at C5-6.  Visualized thyroid is unremarkable.  Visualized lung apices are notable for paraseptal emphysematous changes with biapical pleural parenchymal scarring.  IMPRESSION: 7 x 10 mm focus of parenchymal hemorrhage/contusion in the subcortical left occipital lobe.  Mild soft tissue swelling/extracranial hematoma overlying the right frontal bone. No evidence of calvarial fracture.  No evidence of traumatic injury to the cervical spine. Mild-to-moderate degenerative changes.  Critical value/emergent results were called by telephone at the time of interpretation on 10/16/2013 at 11:23 PM to Dr. Linwood Dibbles, who verbally acknowledged these results.   Electronically Signed   By: Charline Bills M.D.   On: 10/16/2013 23:24   Ct Cervical Spine Wo Contrast  10/16/2013   CLINICAL DATA:  Found down,  right forehead hematoma, dementia  EXAM: CT HEAD WITHOUT CONTRAST  CT CERVICAL SPINE WITHOUT CONTRAST  TECHNIQUE: Multidetector CT imaging of the head and cervical spine was performed following the standard protocol without intravenous contrast. Multiplanar CT image reconstructions of the cervical spine were also generated.  COMPARISON:  08/10/2013  FINDINGS: CT HEAD FINDINGS  7 x 10 mm focus of parenchymal hemorrhage/contusion in the subcortical left occipital lobe (series 3/ image 12).  No extra-axial fluid collection or intraventricular hemorrhage.  No mass lesion, mass effect, or midline shift.  No CT evidence of acute infarction.  Subcortical white matter and periventricular small vessel ischemic changes. Intracranial atherosclerosis.  Global cortical atrophy.  No ventriculomegaly.  The visualized paranasal sinuses are essentially clear. The mastoid air cells are unopacified.  Mild soft tissue swelling/extracranial hematoma overlying the right frontal bone (series 3/image 16).  No evidence of calvarial fracture.  CT CERVICAL SPINE FINDINGS  Normal cervical lordosis.  No evidence of fracture or dislocation. Vertebral body heights are maintained. Dens appears intact.  No prevertebral soft tissue swelling.  Mild to moderate degenerative changes, most prominent at C5-6.  Visualized thyroid is unremarkable.  Visualized lung  apices are notable for paraseptal emphysematous changes with biapical pleural parenchymal scarring.  IMPRESSION: 7 x 10 mm focus of parenchymal hemorrhage/contusion in the subcortical left occipital lobe.  Mild soft tissue swelling/extracranial hematoma overlying the right frontal bone. No evidence of calvarial fracture.  No evidence of traumatic injury to the cervical spine. Mild-to-moderate degenerative changes.  Critical value/emergent results were called by telephone at the time of interpretation on 10/16/2013 at 11:23 PM to Dr. Linwood DibblesJon Knapp, who verbally acknowledged these results.    Electronically Signed   By: Charline BillsSriyesh  Krishnan M.D.   On: 10/16/2013 23:24   Dg Pelvis Portable  10/16/2013   CLINICAL DATA:  Fall  EXAM: PORTABLE PELVIS 1-2 VIEWS  COMPARISON:  None.  FINDINGS: No fracture or dislocation is seen.  Bilateral hip joint spaces are symmetric.  Visualized bony pelvis appears intact.  Mild degenerative changes of the lower lumbar spine.  IMPRESSION: No fracture or dislocation is seen.   Electronically Signed   By: Charline BillsSriyesh  Krishnan M.D.   On: 10/16/2013 22:15    Scheduled Meds: . buPROPion  150 mg Oral q morning - 10a  . chlorhexidine  15 mL Mouth/Throat BID  . cholecalciferol  400 Units Oral Daily  . divalproex  250 mg Oral TID WC  . donepezil  10 mg Oral Daily  . Memantine HCl ER  1 capsule Oral Daily  . multivitamin with minerals  1 tablet Oral Daily  . sodium chloride  3 mL Intravenous Q12H   Continuous Infusions:    Alexa Knox, Alexa Knox  [facility] Pager [phone]. If 8PM-8AM, please contact night-coverage at [email or web address], password [email or web address] 10/18/2013, 12:04 PM  LOS: 2 days

## 2013-10-18 NOTE — ED Provider Notes (Signed)
I saw and evaluated the patient, reviewed the resident's note and I agree with the findings and plan.  Pt presented to ED after un witnessed fall. CT scan reveal small cerebral contusion.  Case discussed with neurosurgery.  Plan on admission to the medical service for observation.  Serial CT scan.     Celene KrasJon R Lynora Dymond, MD 10/18/13 [phone]

## 2013-10-19 LAB — CLOSTRIDIUM DIFFICILE BY PCR: Toxigenic C. Difficile by PCR: NEGATIVE

## 2013-10-19 MED ORDER — ASPIRIN 81 MG PO TABS: 81.0000 mg | ORAL_TABLET | Freq: Every day | ORAL | Status: DC

## 2013-10-19 NOTE — Discharge Summary (Addendum)
Physician Discharge Summary  Alexa Knox:[id number] DOB: Dec 20, 1940 DOA: 10/16/2013  PCP: [name], MD  Admit date: 10/16/2013 Discharge date: 10/19/2013  Time spent: 35 minutes  Recommendations for Outpatient Follow-up:  1. Follow up with PCP in 2 weeks 2.   Discharge Diagnoses:  Principal Problem:   Cerebral parenchymal hemorrhage Active Problems:   Coronary artery disease   Hypertension   Osteoporosis   Dementia   Discharge Condition: stbale  Diet recommendation: heart healthy  Filed Weights   10/17/13 0200  Weight: 59.3 kg (130 lb 11.7 oz)    History of present illness:  73 y.o. female resident of the [facility] who was found on the floor in her room by staff and sent to the ED for evaluation. She has Dementia and can not give a history. She was evaluated in the ED and had a CT scan performed which revealed a left occipital lobe parenchymal Hemorrhage/Contusion. The EDP contacted Neurosurgery who gave recommendations for observation and repeat ct scan of the brain in 48 hours and per neurosurgery the condition is non-surgical at this time. She was referred for medical admission.   Hospital Course:  Cerebral parenchymal hemorrhage/contusion:  -Status post fall, monitor for neurologic changes with no abnormalities. Hold ASA for 1 week. -Per neurosurgical for consultation, repeat CT scan: stable from previous.  -No other evidence of fractures or abnormalities.   Dementia:  -Patient is on restricted and Namenda she is also on divalproex.  -He is pleasantly confused but awake.   HTN/CAD  -Stable no complaints.   Fall  -Unwitnessed fall, she was found on the floor by the assisted living staff.  -Has frontal bruising.  -PT/OT, she might need higher level of care and discharge   Procedures:  Ct head  Consultations:  Neurosurgery  Discharge Exam: Filed Vitals:   10/19/13 0920  BP: 115/66  Pulse: 75  Temp: 97.8 F  (36.6 C)  Resp: 18    General: A&O x2 Cardiovascular: rrr Respiratory: good air movement CTA B/L  Discharge Instructions  Discharge Orders   Future Orders Complete By Expires   Diet - low sodium heart healthy  As directed    Increase activity slowly  As directed        Medication List         alendronate 70 MG tablet  Commonly known as:  FOSAMAX  Take 70 mg by mouth once a week. Take with a full glass of water on an empty stomach. Patient takes on Sat.     aspirin 81 MG tablet  Take 1 tablet (81 mg total) by mouth daily.  Start taking on:  11/02/2013     chlorhexidine 0.12 % solution  Commonly known as:  PERIDEX  Use as directed 15 mLs in the mouth or throat 2 (two) times daily.     DAILY VITE Tabs  Take 1 tablet by mouth daily.     divalproex 125 MG capsule  Commonly known as:  DEPAKOTE SPRINKLE  Take 250 mg by mouth 3 (three) times daily. With meals, may open capsule and sprinkle on pudding     donepezil 10 MG tablet  Commonly known as:  ARICEPT  Take 10 mg by mouth daily.     LORazepam 0.5 MG tablet  Commonly known as:  ATIVAN  Take 0.5 mg by mouth every 6 (six) hours as needed for anxiety (agitation).     NAMENDA XR 7 MG Cp24  Generic drug:  Memantine HCl  ER  Take 1 capsule by mouth daily.     vitamin D (CHOLECALCIFEROL) 400 UNITS tablet  Take 400 Units by mouth daily.     WELLBUTRIN SR 150 MG 12 hr tablet  Generic drug:  buPROPion  Take 150 mg by mouth every morning.       No Known Allergies    The results of significant diagnostics from this hospitalization (including imaging, microbiology, ancillary and laboratory) are listed below for reference.    Significant Diagnostic Studies: Ct Head Wo Contrast  10/18/2013   CLINICAL DATA:  Parenchymal hemorrhage.  EXAM: CT HEAD WITHOUT CONTRAST  TECHNIQUE: Contiguous axial images were obtained from the base of the skull through the vertex without intravenous contrast.  COMPARISON:  10/16/2013  FINDINGS:  No acute osseous findings. No significant change in a small right frontal scalp contusion. The imaged paranasal sinuses remain clear.  Size stable 10 mm parenchymal hematoma in the inferior left occipital lobe. There is no significant mass effect. No new intracranial hemorrhage. No evidence of acute ischemia or hydrocephalus.  Senescent changes include generalized cerebral volume loss and moderately advanced chronic small vessel ischemic injury, with diffuse patchy cerebral white matter low-attenuation.  IMPRESSION: Size stable 1 cm hematoma in the left occipital lobe.   Electronically Signed   By: Tiburcio PeaJonathan  Watts M.D.   On: 10/18/2013 07:00   Ct Head Wo Contrast  10/16/2013   CLINICAL DATA:  Found down, right forehead hematoma, dementia  EXAM: CT HEAD WITHOUT CONTRAST  CT CERVICAL SPINE WITHOUT CONTRAST  TECHNIQUE: Multidetector CT imaging of the head and cervical spine was performed following the standard protocol without intravenous contrast. Multiplanar CT image reconstructions of the cervical spine were also generated.  COMPARISON:  08/10/2013  FINDINGS: CT HEAD FINDINGS  7 x 10 mm focus of parenchymal hemorrhage/contusion in the subcortical left occipital lobe (series 3/ image 12).  No extra-axial fluid collection or intraventricular hemorrhage.  No mass lesion, mass effect, or midline shift.  No CT evidence of acute infarction.  Subcortical white matter and periventricular small vessel ischemic changes. Intracranial atherosclerosis.  Global cortical atrophy.  No ventriculomegaly.  The visualized paranasal sinuses are essentially clear. The mastoid air cells are unopacified.  Mild soft tissue swelling/extracranial hematoma overlying the right frontal bone (series 3/image 16).  No evidence of calvarial fracture.  CT CERVICAL SPINE FINDINGS  Normal cervical lordosis.  No evidence of fracture or dislocation. Vertebral body heights are maintained. Dens appears intact.  No prevertebral soft tissue swelling.   Mild to moderate degenerative changes, most prominent at C5-6.  Visualized thyroid is unremarkable.  Visualized lung apices are notable for paraseptal emphysematous changes with biapical pleural parenchymal scarring.  IMPRESSION: 7 x 10 mm focus of parenchymal hemorrhage/contusion in the subcortical left occipital lobe.  Mild soft tissue swelling/extracranial hematoma overlying the right frontal bone. No evidence of calvarial fracture.  No evidence of traumatic injury to the cervical spine. Mild-to-moderate degenerative changes.  Critical value/emergent results were called by telephone at the time of interpretation on 10/16/2013 at 11:23 PM to Dr. Linwood DibblesJon Knapp, who verbally acknowledged these results.   Electronically Signed   By: Charline BillsSriyesh  Krishnan M.D.   On: 10/16/2013 23:24   Ct Cervical Spine Wo Contrast  10/16/2013   CLINICAL DATA:  Found down, right forehead hematoma, dementia  EXAM: CT HEAD WITHOUT CONTRAST  CT CERVICAL SPINE WITHOUT CONTRAST  TECHNIQUE: Multidetector CT imaging of the head and cervical spine was performed following the standard protocol without intravenous contrast.  Multiplanar CT image reconstructions of the cervical spine were also generated.  COMPARISON:  08/10/2013  FINDINGS: CT HEAD FINDINGS  7 x 10 mm focus of parenchymal hemorrhage/contusion in the subcortical left occipital lobe (series 3/ image 12).  No extra-axial fluid collection or intraventricular hemorrhage.  No mass lesion, mass effect, or midline shift.  No CT evidence of acute infarction.  Subcortical white matter and periventricular small vessel ischemic changes. Intracranial atherosclerosis.  Global cortical atrophy.  No ventriculomegaly.  The visualized paranasal sinuses are essentially clear. The mastoid air cells are unopacified.  Mild soft tissue swelling/extracranial hematoma overlying the right frontal bone (series 3/image 16).  No evidence of calvarial fracture.  CT CERVICAL SPINE FINDINGS  Normal cervical lordosis.   No evidence of fracture or dislocation. Vertebral body heights are maintained. Dens appears intact.  No prevertebral soft tissue swelling.  Mild to moderate degenerative changes, most prominent at C5-6.  Visualized thyroid is unremarkable.  Visualized lung apices are notable for paraseptal emphysematous changes with biapical pleural parenchymal scarring.  IMPRESSION: 7 x 10 mm focus of parenchymal hemorrhage/contusion in the subcortical left occipital lobe.  Mild soft tissue swelling/extracranial hematoma overlying the right frontal bone. No evidence of calvarial fracture.  No evidence of traumatic injury to the cervical spine. Mild-to-moderate degenerative changes.  Critical value/emergent results were called by telephone at the time of interpretation on 10/16/2013 at 11:23 PM to Dr. Linwood Dibbles, who verbally acknowledged these results.   Electronically Signed   By: Charline Bills M.D.   On: 10/16/2013 23:24   Dg Pelvis Portable  10/16/2013   CLINICAL DATA:  Fall  EXAM: PORTABLE PELVIS 1-2 VIEWS  COMPARISON:  None.  FINDINGS: No fracture or dislocation is seen.  Bilateral hip joint spaces are symmetric.  Visualized bony pelvis appears intact.  Mild degenerative changes of the lower lumbar spine.  IMPRESSION: No fracture or dislocation is seen.   Electronically Signed   By: Charline Bills M.D.   On: 10/16/2013 22:15    Microbiology: No results found for this or any previous visit (from the past 240 hour(s)).   Labs: Basic Metabolic Panel:  Recent Labs Lab 10/16/13 2232 10/17/13 0638  NA 144 145  K 4.4 4.0  CL 106 109  CO2 24 25  GLUCOSE 99 87  BUN 25* 20  CREATININE 0.94 0.90  CALCIUM 9.4 8.9   Liver Function Tests: No results found for this basename: AST, ALT, ALKPHOS, BILITOT, PROT, ALBUMIN,  in the last 168 hours No results found for this basename: LIPASE, AMYLASE,  in the last 168 hours No results found for this basename: AMMONIA,  in the last 168 hours CBC:  Recent Labs Lab  10/16/13 2232 10/17/13 0638  WBC 5.5 5.0  HGB 13.2 12.6  HCT 38.9 38.5  MCV 91.7 91.7  PLT 158 146*   Cardiac Enzymes: No results found for this basename: CKTOTAL, CKMB, CKMBINDEX, TROPONINI,  in the last 168 hours BNP: BNP (last 3 results) No results found for this basename: PROBNP,  in the last 8760 hours CBG: No results found for this basename: GLUCAP,  in the last 168 hours     Signed:  Marinda Elk  [facility] 10/19/2013, 11:54 AM

## 2013-10-19 NOTE — Progress Notes (Signed)
Pt d/c to snf by ambulance. Assessment stable. 

## 2013-10-19 NOTE — Social Work (Signed)
Spoke with Britta MccreedyBarbara at [facility]- patient is from their memory care unit and she feels they can accept patient back to the ALF at d/c- they will have PT work with her there to continue to increase/improve her safety/mobility. Will await cdiff results for possible d/c-  Reece LevyJanet Rory Xiang, MSW, Theresia MajorsLCSWA [phone]

## 2013-10-19 NOTE — Social Work (Signed)
Patient is ready for d/c back to [facility]- plans confirmed with her POA- Laverta BaltimoreKen Knight who is requesting EMS transport- plans also confirmed with ALF staff-  Reece LevyJanet Lilliann Rossetti, MSW, Theresia MajorsLCSWA [phone]

## 2013-10-29 ENCOUNTER — Emergency Department (HOSPITAL_COMMUNITY): Payer: Medicare Other

## 2013-10-29 ENCOUNTER — Emergency Department (HOSPITAL_COMMUNITY)
Admission: EM | Admit: 2013-10-29 | Discharge: 2013-10-29 | Disposition: A | Discharge status: Home / Self Care | Payer: Medicare Other | Attending: Emergency Medicine | Admitting: Emergency Medicine

## 2013-10-29 ENCOUNTER — Encounter (HOSPITAL_COMMUNITY): Payer: Self-pay | Admitting: Emergency Medicine

## 2013-10-29 DIAGNOSIS — G309 Alzheimer's disease, unspecified: Secondary | ICD-10-CM | POA: Insufficient documentation

## 2013-10-29 DIAGNOSIS — M810 Age-related osteoporosis without current pathological fracture: Secondary | ICD-10-CM | POA: Insufficient documentation

## 2013-10-29 DIAGNOSIS — F3289 Other specified depressive episodes: Secondary | ICD-10-CM | POA: Insufficient documentation

## 2013-10-29 DIAGNOSIS — F329 Major depressive disorder, single episode, unspecified: Secondary | ICD-10-CM | POA: Insufficient documentation

## 2013-10-29 DIAGNOSIS — I2510 Atherosclerotic heart disease of native coronary artery without angina pectoris: Secondary | ICD-10-CM | POA: Insufficient documentation

## 2013-10-29 DIAGNOSIS — Z79899 Other long term (current) drug therapy: Secondary | ICD-10-CM | POA: Insufficient documentation

## 2013-10-29 DIAGNOSIS — I10 Essential (primary) hypertension: Secondary | ICD-10-CM | POA: Insufficient documentation

## 2013-10-29 DIAGNOSIS — F0280 Dementia in other diseases classified elsewhere without behavioral disturbance: Secondary | ICD-10-CM | POA: Insufficient documentation

## 2013-10-29 DIAGNOSIS — Y9210 Unspecified residential institution as the place of occurrence of the external cause: Secondary | ICD-10-CM | POA: Insufficient documentation

## 2013-10-29 DIAGNOSIS — Z7983 Long term (current) use of bisphosphonates: Secondary | ICD-10-CM | POA: Insufficient documentation

## 2013-10-29 DIAGNOSIS — Z043 Encounter for examination and observation following other accident: Secondary | ICD-10-CM | POA: Insufficient documentation

## 2013-10-29 DIAGNOSIS — E46 Unspecified protein-calorie malnutrition: Secondary | ICD-10-CM | POA: Insufficient documentation

## 2013-10-29 DIAGNOSIS — Z7982 Long term (current) use of aspirin: Secondary | ICD-10-CM | POA: Insufficient documentation

## 2013-10-29 DIAGNOSIS — R296 Repeated falls: Secondary | ICD-10-CM | POA: Insufficient documentation

## 2013-10-29 DIAGNOSIS — Y9389 Activity, other specified: Secondary | ICD-10-CM | POA: Insufficient documentation

## 2013-10-29 DIAGNOSIS — W19XXXA Unspecified fall, initial encounter: Secondary | ICD-10-CM

## 2013-10-29 NOTE — ED Provider Notes (Signed)
I saw and evaluated the patient, reviewed the resident's note and I agree with the findings and plan.   EKG Interpretation None       Patient with fall at the nursing home. She is at her normal baseline but unable to provide a good history. There is no sign of trauma. Will get CT head/C-spine based on recent history of intracranial hemorrhage. We'll disposition based on the CT.  Audree CamelScott T Valisha Heslin, MD 10/29/13 [phone]

## 2013-10-29 NOTE — Discharge Instructions (Signed)

## 2013-10-29 NOTE — ED Provider Notes (Signed)
CSN: [id number]     Arrival date & time 10/29/13  1332 History   First MD Initiated Contact with Patient 10/29/13 1511     Chief Complaint  Patient presents with  . Fall     (Consider location/radiation/quality/duration/timing/severity/associated sxs/prior Treatment) HPI Comments: Report from nursing home when I called: she was found down, unwitnessed fall. No LOC or syncope as patient cried out immediately after fall. Has hx of mechanical falls and was seen in our ED 1.5 weeks ago for similar. Pt acting at baseline mental status. Not on anticoagulants per [facility] sent from ALF.  Patient is a 73 y.o. female presenting with fall. The history is provided by the nursing home and the EMS personnel.  Fall This is a new problem. The current episode started today. The problem occurs constantly. The problem has been unchanged. Pertinent negatives include no abdominal pain, chest pain, coughing, diaphoresis, fever, rash, vomiting or weakness. Associated symptoms comments: Pt denies any sxs, she is demented and denies she fell, but has no current complaints or pain and per EMS is acting her normal mental status. Nothing aggravates the symptoms. She has tried nothing for the symptoms. The treatment provided no relief.    Past Medical History  Diagnosis Date  . Osteoporosis   . Dementia   . Depression   . Hypertension   . Malnutrition   . Coronary artery disease    History reviewed. No pertinent past surgical history. No family history on file. History  Substance Use Topics  . Smoking status: Never Smoker   . Smokeless tobacco: Not on file  . Alcohol Use: No   OB History   Grav Para Term Preterm Abortions TAB SAB Ect Mult Living                 Review of Systems  Unable to perform ROS: Dementia  Constitutional: Negative for fever and diaphoresis.  Respiratory: Negative for cough.   Cardiovascular: Negative for chest pain.  Gastrointestinal: Negative for vomiting and abdominal pain.   Skin: Negative for rash.  Neurological: Negative for weakness.      Allergies  Review of patient's allergies indicates no known allergies.  Home Medications   Current Outpatient Rx  Name  Route  Sig  Dispense  Refill  . alendronate (FOSAMAX) 70 MG tablet   Oral   Take 70 mg by mouth once a week. Take with a full glass of water on an empty stomach. Patient takes on Sat.         Marland Kitchen aspirin 81 MG tablet   Oral   Take 1 tablet (81 mg total) by mouth daily.   30 tablet   0   . buPROPion (WELLBUTRIN SR) 150 MG 12 hr tablet   Oral   Take 150 mg by mouth every morning.         . chlorhexidine (PERIDEX) 0.12 % solution   Mouth/Throat   Use as directed 15 mLs in the mouth or throat 2 (two) times daily.         . divalproex (DEPAKOTE SPRINKLE) 125 MG capsule   Oral   Take 250 mg by mouth 3 (three) times daily. With meals, may open capsule and sprinkle on pudding         . donepezil (ARICEPT) 5 MG tablet   Oral   Take 5 mg by mouth at bedtime.         . Multiple Vitamin (DAILY VITE) TABS   Oral   Take 1  tablet by mouth daily.         . vitamin D, CHOLECALCIFEROL, 400 UNITS tablet   Oral   Take 400 Units by mouth daily.          BP 98/74  Pulse 59  Temp(Src) 98.4 F (36.9 C) (Oral)  Resp 18  SpO2 98% Physical Exam  Constitutional: She appears well-developed and well-nourished. No distress.  HENT:  Head: Normocephalic and atraumatic.  No obvious trauma to head or face, no ttp or pain response  Eyes: Conjunctivae and EOM are normal. Pupils are equal, round, and reactive to light. Right eye exhibits no discharge. Left eye exhibits no discharge. No scleral icterus.  Neck: Normal range of motion. Neck supple.  No C spine ttp, ROM normal without deficit  Cardiovascular: Normal rate, normal heart sounds and intact distal pulses.  Exam reveals no gallop and no friction rub.   No murmur heard. Pulmonary/Chest: Effort normal. No respiratory distress. She has no  wheezes. She has no rales. She exhibits no tenderness.  Abdominal: Soft. Bowel sounds are normal. She exhibits no distension. There is no tenderness.  Neurological: She is alert. She displays normal reflexes. She exhibits normal muscle tone. Coordination normal.  Not oriented to place or year, does not comply with full neuro exam but has 5/5 strength in all exts, and grossly intact sensation, no gross CN deficits  Skin: Skin is warm. No rash noted. She is not diaphoretic.    ED Course  Procedures (including critical care time) Labs Review Labs Reviewed - No data to display Imaging Review Ct Head Wo Contrast  10/29/2013   CLINICAL DATA:  Status post fall with history of dementia  EXAM: CT HEAD WITHOUT CONTRAST  CT CERVICAL SPINE WITHOUT CONTRAST  TECHNIQUE: Multidetector CT imaging of the head and cervical spine was performed following the standard protocol without intravenous contrast. Multiplanar CT image reconstructions of the cervical spine were also generated.  COMPARISON:  CT HEAD W/O CM dated 10/18/2013; CT HEAD W/O CM dated 10/16/2013  FINDINGS: CT HEAD FINDINGS  There is mild diffuse cerebral and cerebellar atrophy with compensatory ventriculomegaly. This is stable since the earlier study. There is no evidence of an acute intracranial hemorrhage nor of an evolving ischemic infarction. There is markedly decreased density in the deep white matter of both cerebral hemispheres consistent with chronic small vessel ischemic type change. The cerebellum exhibits no acute abnormality.  At bone window settings the observed portions of the paranasal sinuses and mastoid air cells are clear. There is no evidence of an acute skull fracture.  CT CERVICAL SPINE FINDINGS  The cervical vertebral bodies are preserved in height. There is stable anterolisthesis of C3 with respect to C4 and C4 with respect to C5 amounting to approximately 3 mm. There is disc space narrowing at all cervical levels with the exception of  C2-3. The prevertebral soft tissue spaces appear normal. There is no evidence of a perched facet or spinous process fracture. The odontoid is intact and the lateral masses of C1 align normally with those of C2. The bony ring at each cervical level is intact. There is degenerative facet joint change bilaterally at multiple levels. The observed portions of the first and second ribs appear normal. The pulmonary apices exhibit mild scarring. The soft tissues of the neck exhibit no acute abnormalities.  IMPRESSION: 1. There is no evidence of an acute intracranial hemorrhage or an evolving ischemic infarction. 2. There is stable diffuse cerebral and cerebellar atrophy with compensatory  ventriculomegaly. 3. There is no evidence of an acute skull fracture. 4. There is no evidence of an acute cervical spine fracture nor dislocation. There is stable chronic degenerative disc and facet joint change present.   Electronically Signed   By: David  SwazilandJordan   On: 10/29/2013 16:40   Ct Cervical Spine Wo Contrast  10/29/2013   CLINICAL DATA:  Status post fall with history of dementia  EXAM: CT HEAD WITHOUT CONTRAST  CT CERVICAL SPINE WITHOUT CONTRAST  TECHNIQUE: Multidetector CT imaging of the head and cervical spine was performed following the standard protocol without intravenous contrast. Multiplanar CT image reconstructions of the cervical spine were also generated.  COMPARISON:  CT HEAD W/O CM dated 10/18/2013; CT HEAD W/O CM dated 10/16/2013  FINDINGS: CT HEAD FINDINGS  There is mild diffuse cerebral and cerebellar atrophy with compensatory ventriculomegaly. This is stable since the earlier study. There is no evidence of an acute intracranial hemorrhage nor of an evolving ischemic infarction. There is markedly decreased density in the deep white matter of both cerebral hemispheres consistent with chronic small vessel ischemic type change. The cerebellum exhibits no acute abnormality.  At bone window settings the observed portions  of the paranasal sinuses and mastoid air cells are clear. There is no evidence of an acute skull fracture.  CT CERVICAL SPINE FINDINGS  The cervical vertebral bodies are preserved in height. There is stable anterolisthesis of C3 with respect to C4 and C4 with respect to C5 amounting to approximately 3 mm. There is disc space narrowing at all cervical levels with the exception of C2-3. The prevertebral soft tissue spaces appear normal. There is no evidence of a perched facet or spinous process fracture. The odontoid is intact and the lateral masses of C1 align normally with those of C2. The bony ring at each cervical level is intact. There is degenerative facet joint change bilaterally at multiple levels. The observed portions of the first and second ribs appear normal. The pulmonary apices exhibit mild scarring. The soft tissues of the neck exhibit no acute abnormalities.  IMPRESSION: 1. There is no evidence of an acute intracranial hemorrhage or an evolving ischemic infarction. 2. There is stable diffuse cerebral and cerebellar atrophy with compensatory ventriculomegaly. 3. There is no evidence of an acute skull fracture. 4. There is no evidence of an acute cervical spine fracture nor dislocation. There is stable chronic degenerative disc and facet joint change present.   Electronically Signed   By: David  SwazilandJordan   On: 10/29/2013 16:40     EKG Interpretation None      MDM   72 y.o. WF w/ PMHx of osteoporosis, severe dementia, CAD who [facility] w/ cc: of fall today. Unwitnessed fall, but per nursing home there was no syncope, she cried out as soon as she fell. Pt denies falling, she has severe dementia, and denies no sxs. No evidence of injuries on exam, well appearing, at baseline per nursing home. Head CT and Neck CT with no acute abnormalities. No acute injuries. D/w results with POA who is at bedside who states he will talk to ALF to see if they can fix something because of  recurrent trips to ED for falls. Discharged. Care of case d/w my attending.  Final diagnoses:  Olen PelFall        Briar Sword, MD 10/29/13 [phone]

## 2013-10-29 NOTE — ED Notes (Signed)
Placed call to [facility] for transportation [facility]

## 2013-10-29 NOTE — ED Notes (Addendum)
To ED via [facility] from [facility]--- fell approx 30 minutes ago- no obvious injury, no complaints of injury. Pt has old bruising around right eye, denies pain. Pt alert, oriented to nursing home, not to date or time

## 2013-11-08 ENCOUNTER — Telehealth: Payer: Self-pay | Admitting: Neurology

## 2013-11-08 ENCOUNTER — Encounter: Payer: Self-pay | Admitting: Neurology

## 2013-11-08 NOTE — Telephone Encounter (Signed)
Rose w/[facility] returned Tashia's call, no answer I advised she may be with a pt, but will have her call her back with the schedule for the pt for this week with Dr. Hosie PoissonSumner. Thanks

## 2013-11-08 NOTE — Telephone Encounter (Signed)
Spoke with Rose at [facility], to r/s patient's appointment for 3/24, Rose stated that patient's family will be bringing her to the appointment, she will contact them to find out the best time and will call back to r/s

## 2013-11-08 NOTE — Telephone Encounter (Signed)
Pt returning your call. Please call back °

## 2013-11-09 ENCOUNTER — Institutional Professional Consult (permissible substitution): Payer: Medicare Other | Admitting: Neurology

## 2013-11-10 ENCOUNTER — Encounter: Payer: Self-pay | Admitting: Neurology

## 2013-11-10 ENCOUNTER — Ambulatory Visit (INDEPENDENT_AMBULATORY_CARE_PROVIDER_SITE_OTHER): Payer: Medicare Other | Admitting: Neurology

## 2013-11-10 VITALS — BP 105/67 | HR 70

## 2013-11-10 DIAGNOSIS — R4189 Other symptoms and signs involving cognitive functions and awareness: Secondary | ICD-10-CM

## 2013-11-10 DIAGNOSIS — I2510 Atherosclerotic heart disease of native coronary artery without angina pectoris: Secondary | ICD-10-CM

## 2013-11-10 DIAGNOSIS — I619 Nontraumatic intracerebral hemorrhage, unspecified: Secondary | ICD-10-CM

## 2013-11-10 DIAGNOSIS — R2681 Unsteadiness on feet: Secondary | ICD-10-CM

## 2013-11-10 DIAGNOSIS — F09 Unspecified mental disorder due to known physiological condition: Secondary | ICD-10-CM

## 2013-11-10 DIAGNOSIS — R269 Unspecified abnormalities of gait and mobility: Secondary | ICD-10-CM

## 2013-11-10 NOTE — Progress Notes (Signed)
[name]    Provider:  Dr Hosie PoissonSumner Referring Provider: Florentina Jennyripp, Henry, MD Primary Care Physician:  Florentina JennyRIPP, HENRY, MD  CC:  Cognitive decline and gait instability  HPI:  Alexa Knox is a 73 y.o. female here as a referral from Dr. Redmond Schoolripp for gait instability and falls. Had severe fall back in February which showed a small left occipital hemorrhage which was thought to be traumatic.Caregiver reports that she refuses to use a walker or a cane. For the recent fall she was found on the floor, unsure how she fell. She is currently on a puree diet, has some difficulty swallowing. She has had a chronic cognitive decline, was given a diagnosis of Alzheimer's dementia in the past, has also been given questionable diagnosis of vascular dementia. Since recent hospital stay in February they have noted an overall decline in her mental status and gait. Currently taking Aricept and VPA. Namenda was recently discontinued.   Unclear family history, unsure if any neurodegenerative processes in the family history.   Living in NH since 2010. Severe memory loss was related to memory decline.   Per ED notes: Alexa Knox is a 73 y.o. female resident of the [facility] who was found on the floor in her room by staff and sent to the ED for evaluation. She has Dementia and can not give a history. She was evaluated in the ED and had a CT scan performed which revealed a left occipital lobe parenchymal Hemorrhage/Contusion. The EDP contacted Neurosurgery who gave recommendations for observation and repeat ct scan of the brain in 48 hours and per neurosurgery the condition is non-surgical at this time. She was referred for medical admission.   Review of Systems: Out of a complete 14 system review, the patient complains of only the following symptoms, and all other reviewed systems are negative. + cognitive decline, falls  History   Social History  . Marital Status:  Divorced    Spouse Name: N/A    Number of Children: N/A  . Years of Education: N/A   Occupational History  . Not on file.   Social History Main Topics  . Smoking status: Never Smoker   . Smokeless tobacco: Not on file  . Alcohol Use: No  . Drug Use: No  . Sexual Activity: Not on file   Other Topics Concern  . Not on file   Social History Narrative  . No narrative on file    No family history on file.  Past Medical History  Diagnosis Date  . Osteoporosis   . Dementia   . Depression   . Hypertension   . Malnutrition   . Coronary artery disease     No past surgical history on file.  Current Outpatient Prescriptions  Medication Sig Dispense Refill  . alendronate (FOSAMAX) 70 MG tablet Take 70 mg by mouth once a week. Take with a full glass of water on an empty stomach. Patient takes on Sat.      Marland Kitchen. aspirin 81 MG tablet Take 1 tablet (81 mg total) by mouth daily.  30 tablet  0  . buPROPion (WELLBUTRIN SR) 150 MG 12 hr tablet Take 150 mg by mouth every morning.      . chlorhexidine (PERIDEX) 0.12 % solution Use as directed 15 mLs in the mouth or throat 2 (two) times daily.      . divalproex (DEPAKOTE SPRINKLE) 125 MG capsule Take 250 mg by mouth 3 (three) times daily. With meals,  may open capsule and sprinkle on pudding      . donepezil (ARICEPT) 5 MG tablet Take 5 mg by mouth at bedtime.      Marland Kitchen LORazepam (ATIVAN) 0.5 MG tablet Take 0.5 mg by mouth every 6 (six) hours as needed for anxiety.      . Multiple Vitamin (DAILY VITE) TABS Take 1 tablet by mouth daily.      Marland Kitchen NAMENDA XR 7 MG CP24       . vitamin D, CHOLECALCIFEROL, 400 UNITS tablet Take 400 Units by mouth daily.       No current facility-administered medications for this visit.    Allergies as of 11/10/2013  . (No Known Allergies)    Vitals: BP 105/67  Pulse 70 Last Weight:  Wt Readings from Last 1 Encounters:  10/17/13 130 lb 11.7 oz (59.3 kg)   Last Height:   Ht Readings from Last 1 Encounters:    10/17/13 5\' 7"  (1.702 m)     Physical exam: Exam: Gen: NAD, conversant Eyes: anicteric sclerae, moist conjunctivae HENT: Atraumatic, oropharynx clear Neck: Trachea midline; supple,  Lungs: CTA, no wheezing, rales, rhonic                          CV: RRR, no MRG Abdomen: Soft, non-tender;  Extremities: No peripheral edema  Skin: Normal temperature, no rash,  Psych: Appropriate affect, pleasant  Neuro: MS: MOCA 11/30  CN: PERRL, EOMI no nystagmus with exception of minimally impaired upward gaze, no ptosis, sensation intact to LT V1-V3 bilat, face symmetric, no weakness, hearing grossly intact, palate elevates symmetrically, shoulder shrug 5/5 bilat,  tongue protrudes midline, no fasiculations noted.  Motor: normal bulk and tone, decreased blink response Moves all extremities symmetrically and against light resistance  Coord: mild postural and intention tremor bilateral hands, no rest tremor noted  Reflexes: symmetrical, bilat downgoing toes  Sens: LT intact in all extremities  Gait: requires assistance with stand, upright stance, shuffling steps, wide based, unsteady    Assessment:  After physical and neurologic examination, review of laboratory studies, imaging, neurophysiology testing and pre-existing records, assessment will be reviewed on the problem list.  Plan:  Treatment plan and additional workup will be reviewed under Problem List.  1)Cognitive decline 2)Gait instability  72y/o woman with history of cognitive decline and gait instability presenting for initial evaluation. Had recent fall resulting in an occipital ICH and hospital stay. Unclear etiology of symptoms, has current diagnosis of Alzheimer's dementia which is consistent with history. With exam findings of mild tremor, impaired vertical gaze and marked gait instability would also question an atypical parkinsonism, especially PSP. Will try switching patient from Aricept to Exelon. Will refer to PT for  gait training. Follow up in 6 months.    Elspeth Cho, DO  [facility] [address] [place], [place] [address]  Phone [phone] Fax [phone]

## 2013-11-11 ENCOUNTER — Ambulatory Visit: Payer: Self-pay | Admitting: Neurology

## 2013-12-20 ENCOUNTER — Emergency Department (HOSPITAL_COMMUNITY)
Admission: EM | Admit: 2013-12-20 | Discharge: 2013-12-20 | Disposition: A | Discharge status: Home / Self Care | Payer: Medicare Other | Attending: Emergency Medicine | Admitting: Emergency Medicine

## 2013-12-20 ENCOUNTER — Encounter (HOSPITAL_COMMUNITY): Payer: Self-pay | Admitting: Emergency Medicine

## 2013-12-20 DIAGNOSIS — Z7982 Long term (current) use of aspirin: Secondary | ICD-10-CM | POA: Insufficient documentation

## 2013-12-20 DIAGNOSIS — F329 Major depressive disorder, single episode, unspecified: Secondary | ICD-10-CM | POA: Insufficient documentation

## 2013-12-20 DIAGNOSIS — I10 Essential (primary) hypertension: Secondary | ICD-10-CM | POA: Insufficient documentation

## 2013-12-20 DIAGNOSIS — Z7983 Long term (current) use of bisphosphonates: Secondary | ICD-10-CM | POA: Insufficient documentation

## 2013-12-20 DIAGNOSIS — M810 Age-related osteoporosis without current pathological fracture: Secondary | ICD-10-CM | POA: Insufficient documentation

## 2013-12-20 DIAGNOSIS — E46 Unspecified protein-calorie malnutrition: Secondary | ICD-10-CM | POA: Insufficient documentation

## 2013-12-20 DIAGNOSIS — I2510 Atherosclerotic heart disease of native coronary artery without angina pectoris: Secondary | ICD-10-CM | POA: Insufficient documentation

## 2013-12-20 DIAGNOSIS — W19XXXA Unspecified fall, initial encounter: Secondary | ICD-10-CM | POA: Insufficient documentation

## 2013-12-20 DIAGNOSIS — Y9210 Unspecified residential institution as the place of occurrence of the external cause: Secondary | ICD-10-CM | POA: Insufficient documentation

## 2013-12-20 DIAGNOSIS — Y939 Activity, unspecified: Secondary | ICD-10-CM | POA: Insufficient documentation

## 2013-12-20 DIAGNOSIS — Z79899 Other long term (current) drug therapy: Secondary | ICD-10-CM | POA: Insufficient documentation

## 2013-12-20 DIAGNOSIS — S8000XA Contusion of unspecified knee, initial encounter: Secondary | ICD-10-CM | POA: Insufficient documentation

## 2013-12-20 DIAGNOSIS — F3289 Other specified depressive episodes: Secondary | ICD-10-CM | POA: Insufficient documentation

## 2013-12-20 DIAGNOSIS — F0390 Unspecified dementia without behavioral disturbance: Secondary | ICD-10-CM | POA: Insufficient documentation

## 2013-12-20 LAB — URINALYSIS, ROUTINE W REFLEX MICROSCOPIC
Bilirubin Urine: NEGATIVE
Glucose, UA: NEGATIVE mg/dL
Hgb urine dipstick: NEGATIVE
Ketones, ur: NEGATIVE mg/dL
Leukocytes, UA: NEGATIVE
NITRITE: NEGATIVE
PROTEIN: NEGATIVE mg/dL
Specific Gravity, Urine: 1.006 (ref 1.005–1.030)
Urobilinogen, UA: 0.2 mg/dL (ref 0.0–1.0)
pH: 7 (ref 5.0–8.0)

## 2013-12-20 NOTE — ED Notes (Signed)
Per EMS[facility]  Pt was found on floor in supine position.  Pt fall unwitnessed.  No blood thinners.  No visible head bumps. Pt c/o knee pain.  Pt passed Cervical Spinal Assessment on arrival.  Pt oriented to baseline and has dementia.  Pt oriented to self only.  Pt goes by Alexa IvanLiz.  Vitals:  130/90, hr 80, resp 14.

## 2013-12-20 NOTE — ED Notes (Signed)
Per previous RN, pt fell at care facility, c/o knee pain on arrival, denies pain at this time.

## 2013-12-20 NOTE — ED Notes (Signed)
Pt has small skin tear to her left elbow. Skin cleaned and wound covered.

## 2013-12-20 NOTE — ED Notes (Signed)
Pt states she fell to knees.  States she does not recall hitting anywhere else.  No other pains noted.

## 2013-12-20 NOTE — ED Provider Notes (Signed)
CSN: [id number]     Arrival date & time 12/20/13  1212 History   First MD Initiated Contact with Patient 12/20/13 1440     Chief Complaint  Patient presents with  . Fall     (Consider location/radiation/quality/duration/timing/severity/associated sxs/prior Treatment) Patient is a 73 y.o. female presenting with fall. The history is provided by the patient.  Fall    Level V caveat: Dementia  Past Medical History  Diagnosis Date  . Osteoporosis   . Dementia   . Depression   . Hypertension   . Malnutrition   . Coronary artery disease    History reviewed. No pertinent past surgical history. History reviewed. No pertinent family history. History  Substance Use Topics  . Smoking status: Never Smoker   . Smokeless tobacco: Not on file  . Alcohol Use: No   OB History   Grav Para Term Preterm Abortions TAB SAB Ect Mult Living                 Review of Systems  Unable to perform ROS     Allergies  Review of patient's allergies indicates no known allergies.  Home Medications   Prior to Admission medications   Medication Sig Start Date End Date Taking? Authorizing Provider  aspirin 81 MG tablet Take 1 tablet (81 mg total) by mouth daily. 11/02/13  Yes Marinda ElkAbraham Feliz Ortiz, MD  buPROPion [facility](WELLBUTRIN SR) 150 MG 12 hr tablet Take 150 mg by mouth every morning.   Yes Historical Provider, MD  chlorhexidine (PERIDEX) 0.12 % solution Use as directed 15 mLs in the mouth or throat 2 (two) times daily.   Yes Historical Provider, MD  divalproex (DEPAKOTE SPRINKLE) 125 MG capsule Take 250 mg by mouth 2 (two) times daily. With meals, may open capsule and sprinkle on pudding   Yes Historical Provider, MD  LORazepam (ATIVAN) 0.5 MG tablet Take 0.5 mg by mouth every 6 (six) hours as needed for anxiety.   Yes Historical Provider, MD  Multiple Vitamin (DAILY VITE) TABS Take 1 tablet by mouth daily.   Yes Historical Provider, MD  rivastigmine (EXELON) 4.6 mg/24hr Place 4.6 mg onto the skin  daily.   Yes Historical Provider, MD  Skin Protectants, Misc. (BAZA PROTECT EX) Apply 1 application topically 2 (two) times daily.   Yes Historical Provider, MD  vitamin D, CHOLECALCIFEROL, 400 UNITS tablet Take 400 Units by mouth daily.   Yes Historical Provider, MD  alendronate (FOSAMAX) 70 MG tablet Take 70 mg by mouth once a week. Take with a full glass of water on an empty stomach. Patient takes on Sat.    Historical Provider, MD   BP 119/61  Pulse 74  Temp(Src) 98.2 F (36.8 C) (Oral)  Resp 12  SpO2 100% Physical Exam  Nursing note and vitals reviewed. Constitutional: She is oriented to person, place, and time. She appears well-developed and well-nourished.  HENT:  Head: Normocephalic and atraumatic.  Eyes: Conjunctivae and EOM are normal. Pupils are equal, round, and reactive to light.  Neck: Normal range of motion and phonation normal. Neck supple.  Cardiovascular: Normal rate, regular rhythm and intact distal pulses.   Pulmonary/Chest: Effort normal and breath sounds normal. She exhibits no tenderness.  Abdominal: Soft. She exhibits no distension. There is no tenderness. There is no guarding.  Musculoskeletal: Normal range of motion.  Very minor contusion, right anterior knee, without swelling or deformity. Normal range of motion of arms, and legs, bilaterally.  Neurological: She is alert and oriented to  person, place, and time. She exhibits normal muscle tone.  Skin: Skin is warm and dry.  Psychiatric: She has a normal mood and affect. Her behavior is normal. Judgment and thought content normal.    ED Course  Procedures (including critical care time)   Medications - No data to display  Patient Vitals for the past 24 hrs:  BP Temp Temp src Pulse Resp SpO2  12/20/13 1644 119/61 mmHg 98.2 F (36.8 C) Oral 74 12 100 %  12/20/13 1507 134/93 mmHg - - 110 18 97 %  12/20/13 1219 124/67 mmHg 98.2 F (36.8 C) Oral 71 - 95 %      Labs Review Labs Reviewed  URINE CULTURE   URINALYSIS, ROUTINE W REFLEX MICROSCOPIC      MDM   Final diagnoses:  Fall  Contusion of knee    Fall, without  serious injury. Doubt fracture, serious bacterial infection or metabolic instability   Nursing Notes Reviewed/ Care Coordinated Applicable Imaging Reviewed Interpretation of Laboratory Data incorporated into ED treatment  The patient appears reasonably screened and/or stabilized for discharge and I doubt any other medical condition or other [facility] requiring further screening, evaluation, or treatment in the ED at this time prior to discharge.  Plan: Home Medications- usual; Home Treatments- rest; return here if the recommended treatment, does not improve the symptoms; Recommended follow up- PCP for check up 2-3 days    Flint MelterElliott L Khylah Kendra, MD 12/20/13 [phone]

## 2013-12-20 NOTE — ED Notes (Signed)
Pt only able to stand at bedside. Pt able to shuffle her feet but was leaning heavily on 2 staff assist for support. Pt did not want to walk any further and was placed back in the bed.

## 2013-12-20 NOTE — Discharge Instructions (Signed)
Use Tylenol, if needed, for pain.   Contusion A contusion is a deep bruise. Contusions are the result of an injury that caused bleeding under the skin. The contusion may turn blue, purple, or yellow. Minor injuries will give you a painless contusion, but more severe contusions may stay painful and swollen for a few weeks.  CAUSES  A contusion is usually caused by a blow, trauma, or direct force to an area of the body. SYMPTOMS   Swelling and redness of the injured area.  Bruising of the injured area.  Tenderness and soreness of the injured area.  Pain. DIAGNOSIS  The diagnosis can be made by taking a history and physical exam. An X-ray, CT scan, or MRI may be needed to determine if there were any associated injuries, such as fractures. TREATMENT  Specific treatment will depend on what area of the body was injured. In general, the best treatment for a contusion is resting, icing, elevating, and applying cold compresses to the injured area. Over-the-counter medicines may also be recommended for pain control. Ask your caregiver what the best treatment is for your contusion. HOME CARE INSTRUCTIONS   Put ice on the injured area.  Put ice in a plastic bag.  Place a towel between your skin and the bag.  Leave the ice on for 15-20 minutes, 03-04 times a day.  Only take over-the-counter or prescription medicines for pain, discomfort, or fever as directed by your caregiver. Your caregiver may recommend avoiding anti-inflammatory medicines (aspirin, ibuprofen, and naproxen) for 48 hours because these medicines may increase bruising.  Rest the injured area.  If possible, elevate the injured area to reduce swelling. SEEK IMMEDIATE MEDICAL CARE IF:   You have increased bruising or swelling.  You have pain that is getting worse.  Your swelling or pain is not relieved with medicines. MAKE SURE YOU:   Understand these instructions.  Will watch your condition.  Will get help right away  if you are not doing well or get worse. Document Released: 05/15/2005 Document Revised: 10/28/2011 Document Reviewed: 06/10/2011 [facility] Patient Information 2014 [employer] [place].

## 2013-12-20 NOTE — ED Notes (Signed)
[facility] called for transport.  

## 2013-12-21 LAB — URINE CULTURE
Colony Count: NO GROWTH
Culture: NO GROWTH

## 2014-05-12 ENCOUNTER — Ambulatory Visit: Payer: Medicare Other | Admitting: Neurology

## 2014-05-17 ENCOUNTER — Encounter: Payer: Self-pay | Admitting: Neurology

## 2014-05-17 ENCOUNTER — Ambulatory Visit (INDEPENDENT_AMBULATORY_CARE_PROVIDER_SITE_OTHER): Payer: Medicare Other | Admitting: Neurology

## 2014-05-17 VITALS — BP 127/83 | HR 86

## 2014-05-17 DIAGNOSIS — F0391 Unspecified dementia with behavioral disturbance: Secondary | ICD-10-CM

## 2014-05-17 DIAGNOSIS — R2681 Unsteadiness on feet: Secondary | ICD-10-CM

## 2014-05-17 DIAGNOSIS — R269 Unspecified abnormalities of gait and mobility: Secondary | ICD-10-CM

## 2014-05-17 DIAGNOSIS — F03918 Unspecified dementia, unspecified severity, with other behavioral disturbance: Secondary | ICD-10-CM

## 2014-05-17 DIAGNOSIS — I2510 Atherosclerotic heart disease of native coronary artery without angina pectoris: Secondary | ICD-10-CM

## 2014-05-17 MED ORDER — RIVASTIGMINE 9.5 MG/24HR TD PT24: 9.5000 mg | MEDICATED_PATCH | Freq: Every day | TRANSDERMAL | Status: DC

## 2014-05-17 NOTE — Patient Instructions (Signed)
Overall you are doing fairly well but I do want to suggest a few things today:   Remember to drink plenty of fluid, eat healthy meals and do not skip any meals. Try to eat protein with a every meal and eat a healthy snack such as fruit or nuts in between meals. Try to keep a regular sleep-wake schedule and try to exercise daily, particularly in the form of walking, 20-30 minutes a day, if you can.   As far as your medications are concerned, I would like to suggest: Increasing Exelon patch to 9.5mg  every 24 hours. Common side effects include: nausea, vomiting, diarrhea, dizziness, abdominal pain. Stop if you experience significant side effects and call office.   I would like to see you back in 6 months, sooner if we need to. Please call us with any interim questions, concerns, problems, updates or refill requests.   My clinical assistant and will answer any of your questions and relay your messages to me and also relay most of my messages to you.   Our phone number is [phone]. We also have an after hours call service for urgent matters and there is a physician on-call for urgent questions. For any emergencies you know to call 911 or go to the nearest emergency room

## 2014-05-17 NOTE — Progress Notes (Addendum)
[name]    Provider:  Dr Lucia Gaskins Referring Provider: [name], MD Primary Care Physician:  Alexa Jenny, MD  CC: Cognitive decline and gait instability   Alexa Knox is a 73 y.o. female here as a referral from Dr. Redmond Knox for gait instability and falls. She cannot give a history due to cognitive decline so most of history is from Alexa Knox and caregiver. She has had a chronic cognitive decline, was given a diagnosis of Alzheimer's dementia in the past, has also been given questionable diagnosis of vascular dementia. Had severe fall back in February which showed a small left occipital hemorrhage which was thought to be traumatic.Since recent hospital stay in February they have noted an overall decline in her mental status and gait. Currently on Exelon patch and VPA. Namenda was recently discontinued.   Caregiver reports that she refuses to use a walker or a cane. She is in [facility] and she has an alarm due to fall risk. She is falling once a week. Last time she fell out of bed and they don't know why. Physcially having a hard time getting up and is weak due to disuse.  Very unsteady, fallen with assistance, had physical therapy and wouldn't use the walker, mental status declining, more forgetful, more recent than remote, doesn't enjoy socializing as much, asks same questions over and over, some agitation, no phyisical violence, no halucinations or delusions, still eating well.    Unclear family history, unsure if any neurodegenerative processes in the family history.  Living in [facility] since 2010. Severe memory loss was related to memory decline.  POA declines any further imaging or interventions.   Review of Systems: Patient complains of symptoms per HPI as well as the following symptoms denies hallucinations, delusions, no seizures. Pertinent negatives per HPI. All others negative.   History   Social History  . Marital Status: Divorced    Spouse Name: N/A   Number of Children: N/A  . Years of Education: N/A   Occupational History  . Not on file.   Social History Main Topics  . Smoking status: Never Smoker   . Smokeless tobacco: Not on file  . Alcohol Use: No  . Drug Use: No  . Sexual Activity: Not on file   Other Topics Concern  . Not on file   Social History Narrative  . No narrative on file    No family history on file.  Past Medical History  Diagnosis Date  . Osteoporosis   . Dementia   . Depression   . Hypertension   . Malnutrition   . Coronary artery disease     No past surgical history on file.  Current Outpatient Prescriptions  Medication Sig Dispense Refill  . alendronate (FOSAMAX) 70 MG tablet Take 70 mg by mouth once a week. Take with a full glass of water on an empty stomach. Patient takes on Sat.      Marland Kitchen aspirin 81 MG tablet Take 1 tablet (81 mg total) by mouth daily.  30 tablet  0  . buPROPion (WELLBUTRIN SR) 150 MG 12 hr tablet Take 150 mg by mouth 2 (two) times daily.      . chlorhexidine (PERIDEX) 0.12 % solution Use as directed 15 mLs in the mouth or throat 2 (two) times daily.      . divalproex (DEPAKOTE SPRINKLE) 125 MG capsule Take 250 mg by mouth 2 (two) times daily. With meals, may open capsule and sprinkle on pudding      .  LORazepam (ATIVAN) 0.5 MG tablet Take 0.5 mg by mouth every 6 (six) hours as needed for anxiety.      . Multiple Vitamin (DAILY VITE) TABS Take 1 tablet by mouth daily.      . rivastigmine (EXELON) 9.5 mg/24hr Place 1 patch (9.5 mg total) onto the skin daily.  30 patch  6  . Skin Protectants, Misc. (BAZA PROTECT EX) Apply 1 application topically 2 (two) times daily.      . vitamin D, CHOLECALCIFEROL, 400 UNITS tablet Take 400 Units by mouth daily.       No current facility-administered medications for this visit.    Allergies as of 05/17/2014  . (No Known Allergies)    Vitals: BP 127/83  Pulse 86 Last Weight:  Wt Readings from Last 1 Encounters:  10/17/13 130 lb 11.7 oz  (59.3 kg)   Last Height:   Ht Readings from Last 1 Encounters:  10/17/13 5\' 7"  (1.702 m)    Physical exam: Exam: Gen: NAD, flat affect and not conversant              Eyes: Conjunctivae clear without exudates or hemorrhage   Neuro:  MS:  MOCA 19/30   CN:  PERRL, EOMI with exception of minimally impaired upward gaze, no ptosis, face symmetric  Motor: normal bulk and tone, decreased blink response  Moves all extremities symmetrically and against light resistance  Coord:  no resting tremor noted Sens: LT intact  Gait: Wheelchair bound  Assessment: 73 year old female with Alzheimer's dementia here for follow up. MoCA improved from 11/30 in March to 19/30 today. POA accompanies and does not want any repeat imaging or significant interventions. She has had a chronic cognitive decline, was given a diagnosis of Alzheimer's dementia in the past, has also been given questionable diagnosis of vascular dementia. Had severe fall back in February which showed a small left occipital hemorrhage which was thought to be traumatic.Since recent hospital stay in February they have noted an overall decline in her mental status and gait. Currently on Exelon patch (added lat March at last appointment) and tolerating well, Namenda was discontinued. Will increase Exelon patch dosage from 4.6 to 9.5mg /24 hour Can increase further at next appointment if tolerates.    1)Cognitive decline - Stop 4.6mg  exelon patch and increase to 9.5 2)Gait instability - fall precautions. Had PT and she refuses to use a walking aid. Alarm on bed at nursing home. POA does not feel additional brain imaging will be beneficial.   Follow up in 6 months with nurse practitioner.   Addendum: She is alreadly on the highest does of Exelon, her primary care has increased it. Will stay here.   Alexa DeanAntonia Ahern, MD  [facility] [address] [place], [place] [id number]  Phone [phone] Fax  [phone]

## 2014-05-18 MED ORDER — RIVASTIGMINE 9.5 MG/24HR TD PT24: 9.5000 mg | MEDICATED_PATCH | Freq: Every day | TRANSDERMAL | Status: DC

## 2014-05-19 ENCOUNTER — Telehealth: Payer: Self-pay | Admitting: Neurology

## 2014-05-19 NOTE — Telephone Encounter (Signed)
Alexa Knox, Pharmacist at [facility] @ [phone], need direction clarification for rivastigmine (EXELON) 9.5 mg/24hr.  Please call and advise.

## 2014-05-23 NOTE — Telephone Encounter (Signed)
Done

## 2014-05-23 NOTE — Telephone Encounter (Signed)
FYI Per Pingree Groveollin @ pharmacy patient's psych doctor increased Exelon patch to 13.5 a month ago. Recent rx was sent for 9.5mg  once daily. The patient has not had any problems with higher dose.

## 2014-05-23 NOTE — Telephone Encounter (Signed)
Can you please call back and say that 13.5 is fine.  I did not have her latest medication update.

## 2014-05-23 NOTE — Telephone Encounter (Signed)
Called, 13.5 is fine, thank you

## 2014-05-28 ENCOUNTER — Telehealth: Payer: Self-pay | Admitting: Neurology

## 2014-05-28 NOTE — Telephone Encounter (Signed)
Alexa MooreCasandra - She has an appointment in March however [facility] Woul dyou transfer her appointment for the same day and time but [facility]?  That ok with you Alexa Knox?

## 2014-05-30 NOTE — Telephone Encounter (Signed)
Done

## 2014-05-30 NOTE — Telephone Encounter (Addendum)
This is fine with me. Thanks 

## 2014-11-15 ENCOUNTER — Ambulatory Visit: Payer: Self-pay | Admitting: Adult Health

## 2014-11-17 ENCOUNTER — Encounter: Payer: Self-pay | Admitting: Adult Health

## 2014-11-27 ENCOUNTER — Encounter (HOSPITAL_COMMUNITY): Payer: Self-pay | Admitting: *Deleted

## 2014-11-27 ENCOUNTER — Emergency Department (HOSPITAL_COMMUNITY)
Admission: EM | Admit: 2014-11-27 | Discharge: 2014-11-27 | Disposition: A | Discharge status: Home / Self Care | Payer: Medicare Other | Attending: Emergency Medicine | Admitting: Emergency Medicine

## 2014-11-27 DIAGNOSIS — S81811A Laceration without foreign body, right lower leg, initial encounter: Secondary | ICD-10-CM | POA: Insufficient documentation

## 2014-11-27 DIAGNOSIS — Z79899 Other long term (current) drug therapy: Secondary | ICD-10-CM | POA: Diagnosis not present

## 2014-11-27 DIAGNOSIS — Z8739 Personal history of other diseases of the musculoskeletal system and connective tissue: Secondary | ICD-10-CM | POA: Insufficient documentation

## 2014-11-27 DIAGNOSIS — Y9389 Activity, other specified: Secondary | ICD-10-CM | POA: Insufficient documentation

## 2014-11-27 DIAGNOSIS — F0390 Unspecified dementia without behavioral disturbance: Secondary | ICD-10-CM

## 2014-11-27 DIAGNOSIS — G309 Alzheimer's disease, unspecified: Secondary | ICD-10-CM | POA: Insufficient documentation

## 2014-11-27 DIAGNOSIS — S8991XA Unspecified injury of right lower leg, initial encounter: Secondary | ICD-10-CM | POA: Diagnosis present

## 2014-11-27 DIAGNOSIS — F329 Major depressive disorder, single episode, unspecified: Secondary | ICD-10-CM | POA: Insufficient documentation

## 2014-11-27 DIAGNOSIS — F0280 Dementia in other diseases classified elsewhere without behavioral disturbance: Secondary | ICD-10-CM | POA: Insufficient documentation

## 2014-11-27 DIAGNOSIS — Z8673 Personal history of transient ischemic attack (TIA), and cerebral infarction without residual deficits: Secondary | ICD-10-CM | POA: Diagnosis not present

## 2014-11-27 DIAGNOSIS — Y92128 Other place in nursing home as the place of occurrence of the external cause: Secondary | ICD-10-CM | POA: Insufficient documentation

## 2014-11-27 DIAGNOSIS — W1839XA Other fall on same level, initial encounter: Secondary | ICD-10-CM | POA: Diagnosis not present

## 2014-11-27 DIAGNOSIS — Z8669 Personal history of other diseases of the nervous system and sense organs: Secondary | ICD-10-CM | POA: Insufficient documentation

## 2014-11-27 DIAGNOSIS — W19XXXA Unspecified fall, initial encounter: Secondary | ICD-10-CM

## 2014-11-27 DIAGNOSIS — I10 Essential (primary) hypertension: Secondary | ICD-10-CM | POA: Insufficient documentation

## 2014-11-27 DIAGNOSIS — Y998 Other external cause status: Secondary | ICD-10-CM | POA: Insufficient documentation

## 2014-11-27 DIAGNOSIS — I2510 Atherosclerotic heart disease of native coronary artery without angina pectoris: Secondary | ICD-10-CM | POA: Insufficient documentation

## 2014-11-27 DIAGNOSIS — Z7982 Long term (current) use of aspirin: Secondary | ICD-10-CM | POA: Diagnosis not present

## 2014-11-27 HISTORY — DX: Alzheimer's disease, unspecified: G30.9

## 2014-11-27 HISTORY — DX: Dementia in other diseases classified elsewhere, unspecified severity, without behavioral disturbance, psychotic disturbance, mood disturbance, and anxiety: F02.80

## 2014-11-27 HISTORY — DX: Cerebral infarction, unspecified: I63.9

## 2014-11-27 MED ORDER — LIDOCAINE HCL (PF) 1 % IJ SOLN
5.0000 mL | Freq: Once | INTRAMUSCULAR | Status: AC
  Administered 2014-11-27: 5 mL
  Filled 2014-11-27: qty 5

## 2014-11-27 NOTE — ED Provider Notes (Addendum)
CSN: [id number]     Arrival date & time 11/27/14  0103 History   First MD Initiated Contact with Patient 11/27/14 0134     Chief Complaint  Patient presents with  . Fall     (Consider location/radiation/quality/duration/timing/severity/associated sxs/prior Treatment) Patient is a 74 y.o. female presenting with fall. The history is provided by the EMS personnel. No language interpreter was used.  Fall Associated symptoms comments: The patient suffered an unwitnessed fall while at the NH last evening. She was found on the floor by a nursing assistant and put back into bed. The nursing staff found a wound to right lower extremity and sent her here for evaluation. The patient is significantly demented and cannot reliably contribute to history. .    Past Medical History  Diagnosis Date  . Osteoporosis   . Dementia   . Depression   . Hypertension   . Malnutrition   . Coronary artery disease   . Stroke   . Alzheimer disease    No past surgical history on file. No family history on file. History  Substance Use Topics  . Smoking status: Never Smoker   . Smokeless tobacco: Not on file  . Alcohol Use: No   OB History    No data available     Review of Systems  Unable to perform ROS: Dementia      Allergies  Review of patient's allergies indicates no known allergies.  Home Medications   Prior to Admission medications   Medication Sig Start Date End Date Taking? Authorizing Provider  alendronate (FOSAMAX) 70 MG tablet Take 70 mg by mouth once a week. Take with a full glass of water on an empty stomach. Patient takes on Sat.   Yes Historical Provider, MD  aspirin 81 MG tablet Take 1 tablet (81 mg total) by mouth daily. 11/02/13  Yes Marinda Elk, MD  buPROPion [facility] SR) 150 MG 12 hr tablet Take 150 mg by mouth 2 (two) times daily.   Yes Historical Provider, MD  chlorhexidine (PERIDEX) 0.12 % solution Use as directed 15 mLs in the mouth or throat 2 (two) times  daily.   Yes Historical Provider, MD  divalproex (DEPAKOTE SPRINKLE) 125 MG capsule Take 125 mg by mouth at bedtime. With meals, may open capsule and sprinkle on pudding   Yes Historical Provider, MD  galantamine (RAZADYNE ER) 16 MG 24 hr capsule Take 16 mg by mouth daily with breakfast.   Yes Historical Provider, MD  Multiple Vitamin (DAILY VITE) TABS Take 1 tablet by mouth daily.   Yes Historical Provider, MD  Skin Protectants, Misc. (BAZA PROTECT EX) Apply 1 application topically 2 (two) times daily.   Yes Historical Provider, MD  vitamin D, CHOLECALCIFEROL, 400 UNITS tablet Take 400 Units by mouth daily.   Yes Historical Provider, MD  LORazepam (ATIVAN) 0.5 MG tablet Take 0.5 mg by mouth every 6 (six) hours as needed for anxiety.    Historical Provider, MD  rivastigmine (EXELON) 9.5 mg/24hr Place 1 patch (9.5 mg total) onto the skin daily. Patient not taking: Reported on 11/27/2014 05/18/14   Anson Fret, MD   BP 140/72 mmHg  Pulse 58  Temp(Src) 96.6 F (35.9 C) (Oral)  Resp 18  SpO2 100% Physical Exam  Constitutional: She is oriented to person, place, and time. She appears well-developed and well-nourished. No distress.  HENT:  Head: Normocephalic and atraumatic.  Eyes: Conjunctivae are normal.  Neck: Normal range of motion. Neck supple.  Cardiovascular: Normal rate.  No murmur heard. Pulmonary/Chest: Effort normal. She has no wheezes. She has no rales.  Chest wall atraumatic in appearance  Abdominal: Soft. There is no tenderness.  Abdominal wall atraumatic in appearance.   Musculoskeletal: Normal range of motion.  Moves all extremities. Laceration to lower right shin, midshaft. No bony deformity or apparent tenderness.   Neurological: She is alert and oriented to person, place, and time.  Skin:  See musculoskeletal exam.   Psychiatric: She has a normal mood and affect.    ED Course  Procedures (including critical care time) Labs Review Labs Reviewed - No data to  display  Imaging Review No results found.   EKG Interpretation   Date/Time:  Sunday November 27 2014 01:50:24 EDT Ventricular Rate:  60 PR Interval:  196 QRS Duration: 95 QT Interval:  402 QTC Calculation: 402 R Axis:   -12 Text Interpretation:  Sinus rhythm Borderline low voltage, extremity leads  Electrode noise No significant change since last tracing 10 Aug 2013  Confirmed by KNAPP  MD-I, IVA ([id number]) on 11/27/2014 1:57:04 AM     LACERATION REPAIR Performed by: Elpidio AnisUPSTILL, Howie Rufus A Authorized by: Elpidio AnisUPSTILL, Artemio Dobie A Consent: Verbal consent obtained. Risks and benefits: risks, benefits and alternatives were discussed Consent given by: patient Patient identity confirmed: provided demographic data Prepped and Draped in normal sterile fashion Wound explored  Laceration Location: right LE  Laceration Length: Stellate - 2 cm  No Foreign Bodies seen or palpated  Anesthesia: local infiltration  Local anesthetic: lidocaine 2% w/epinephrine  Anesthetic total: 2 ml  Irrigation method: syringe Amount of cleaning: standard  Skin closure: 3-0 prolene  Number of sutures: 3  Technique: simple interrupted  Patient tolerance: Patient tolerated the procedure well with no immediate complications.  MDM   Final diagnoses:  None    1. Fall 2. Right leg laceration 3. Dementia  There is no visualized injury to head, abdomen or chest. She is moving all extremities and neck without discomfort. Feel injury is limited to right lower leg which was repaired as above note. She was observed over a period of hours without change in condition or baseline mental status. Feel she can be discharged back to her residence nursing facility.    Elpidio AnisShari Azlin Zilberman, PA-C 11/27/14 [id number]  Devoria AlbeIva Knapp, MD 11/27/14 [id number]  Elpidio AnisShari Alysabeth Scalia, PA-C 12/04/14 [id number]  Devoria AlbeIva Knapp, MD 12/08/14 2258

## 2014-11-27 NOTE — Discharge Instructions (Signed)
Sutured Wound Care °Sutures are stitches that can be used to close wounds. Wound care helps prevent pain and infection.  °HOME CARE INSTRUCTIONS  °· Rest and elevate the injured area until all the pain and swelling are gone. °· Only take over-the-counter or prescription medicines for pain, discomfort, or fever as directed by your caregiver. °· After 48 hours, gently wash the area with mild soap and water once a day, or as directed. Rinse off the soap. Pat the area dry with a clean towel. Do not rub the wound. This may cause bleeding. °· Follow your caregiver's instructions for how often to change the bandage (dressing). Stop using a dressing after 2 days or after the wound stops draining. °· If the dressing sticks, moisten it with soapy water and gently remove it. °· Apply ointment on the wound as directed. °· Avoid stretching a sutured wound. °· Drink enough fluids to keep your urine clear or pale yellow. °· Follow up with your caregiver for suture removal as directed. °· Use sunscreen on your wound for the next 3 to 6 months so the scar will not darken. °SEEK IMMEDIATE MEDICAL CARE IF:  °· Your wound becomes red, swollen, hot, or tender. °· You have increasing pain in the wound. °· You have a red streak that extends from the wound. °· There is pus coming from the wound. °· You have a fever. °· You have shaking chills. °· There is a bad smell coming from the wound. °· You have persistent bleeding from the wound. °MAKE SURE YOU:  °· Understand these instructions. °· Will watch your condition. °· Will get help right away if you are not doing well or get worse. °Document Released: 09/12/2004 Document Revised: 10/28/2011 Document Reviewed: 12/09/2010 °ExitCare® Patient Information ©2015 [employer]. This information is not intended to replace advice given to you by your health care provider. Make sure you discuss any questions you have with your health care provider. ° °

## 2014-11-27 NOTE — ED Provider Notes (Signed)
Pt comes from her memory center after having a unwitnessed fall. She states she feels fine. She denies any pain.   Pt has a laceration of her mid/lower RLE that is not actively bleeding. Pt is sleeping but easily awakened, she is pleasant and cooperative.    Medical screening examination/treatment/procedure(s) were conducted as a shared visit with non-physician practitioner(s) and myself.  I personally evaluated the patient during the encounter.   EKG Interpretation   Date/Time:  Sunday November 27 2014 01:50:24 EDT Ventricular Rate:  60 PR Interval:  196 QRS Duration: 95 QT Interval:  402 QTC Calculation: 402 R Axis:   -12 Text Interpretation:  Sinus rhythm Borderline low voltage, extremity leads  Electrode noise No significant change since last tracing 10 Aug 2013  Confirmed by [facility]  MD-I, Jaiyon Wander ([id number]) on 11/27/2014 1:57:04 AM       Devoria AlbeIva Angelo Caroll, MD, Concha PyoFACEP   Chidera Dearcos, MD 11/27/14 [phone]

## 2014-11-27 NOTE — ED Notes (Signed)
Bed: [id number] Expected date:  Expected time:  Means of arrival:  Comments: EMS 73yo F, fall from SNF

## 2014-11-27 NOTE — ED Notes (Signed)
Pt arrives via EMS sp fall from SNF; unknown what time pt fell; staff was notified around 2300 that pt had fallen  (prior shift called to advise that pt and had fallen and to go check on her) PT c/o gen weakness and malaise; pt states that she is finished with Chemo and needs to start on "other meds"; pt with hx of Alzeheirs 's and dements ; pt denies pain' [[s arri

## 2014-11-27 NOTE — ED Notes (Signed)
I find the pt. To be in no distress.  I have just notified [facility]

## 2015-06-21 ENCOUNTER — Encounter (HOSPITAL_COMMUNITY): Payer: Self-pay | Admitting: Emergency Medicine

## 2015-06-21 ENCOUNTER — Emergency Department (HOSPITAL_COMMUNITY)
Admission: EM | Admit: 2015-06-21 | Discharge: 2015-06-21 | Disposition: A | Discharge status: Home / Self Care | Payer: Medicare Other | Attending: Emergency Medicine | Admitting: Emergency Medicine

## 2015-06-21 DIAGNOSIS — M810 Age-related osteoporosis without current pathological fracture: Secondary | ICD-10-CM | POA: Insufficient documentation

## 2015-06-21 DIAGNOSIS — Z23 Encounter for immunization: Secondary | ICD-10-CM | POA: Diagnosis not present

## 2015-06-21 DIAGNOSIS — F0280 Dementia in other diseases classified elsewhere without behavioral disturbance: Secondary | ICD-10-CM | POA: Diagnosis not present

## 2015-06-21 DIAGNOSIS — Z79899 Other long term (current) drug therapy: Secondary | ICD-10-CM | POA: Insufficient documentation

## 2015-06-21 DIAGNOSIS — S51811A Laceration without foreign body of right forearm, initial encounter: Secondary | ICD-10-CM

## 2015-06-21 DIAGNOSIS — S51801A Unspecified open wound of right forearm, initial encounter: Secondary | ICD-10-CM | POA: Diagnosis not present

## 2015-06-21 DIAGNOSIS — W1839XA Other fall on same level, initial encounter: Secondary | ICD-10-CM | POA: Diagnosis not present

## 2015-06-21 DIAGNOSIS — G309 Alzheimer's disease, unspecified: Secondary | ICD-10-CM | POA: Diagnosis not present

## 2015-06-21 DIAGNOSIS — I10 Essential (primary) hypertension: Secondary | ICD-10-CM | POA: Diagnosis not present

## 2015-06-21 DIAGNOSIS — Y9389 Activity, other specified: Secondary | ICD-10-CM | POA: Insufficient documentation

## 2015-06-21 DIAGNOSIS — Y998 Other external cause status: Secondary | ICD-10-CM | POA: Insufficient documentation

## 2015-06-21 DIAGNOSIS — W19XXXA Unspecified fall, initial encounter: Secondary | ICD-10-CM

## 2015-06-21 DIAGNOSIS — Z8673 Personal history of transient ischemic attack (TIA), and cerebral infarction without residual deficits: Secondary | ICD-10-CM | POA: Diagnosis not present

## 2015-06-21 DIAGNOSIS — Z7982 Long term (current) use of aspirin: Secondary | ICD-10-CM | POA: Insufficient documentation

## 2015-06-21 DIAGNOSIS — I2510 Atherosclerotic heart disease of native coronary artery without angina pectoris: Secondary | ICD-10-CM | POA: Diagnosis not present

## 2015-06-21 DIAGNOSIS — F329 Major depressive disorder, single episode, unspecified: Secondary | ICD-10-CM | POA: Insufficient documentation

## 2015-06-21 DIAGNOSIS — Y9289 Other specified places as the place of occurrence of the external cause: Secondary | ICD-10-CM | POA: Insufficient documentation

## 2015-06-21 MED ORDER — LIDOCAINE HCL 2 % IJ SOLN
20.0000 mL | Freq: Once | INTRAMUSCULAR | Status: AC
  Administered 2015-06-21: 400 mg
  Filled 2015-06-21: qty 20

## 2015-06-21 MED ORDER — TETANUS-DIPHTH-ACELL PERTUSSIS 5-2.5-18.5 LF-MCG/0.5 IM SUSP
0.5000 mL | Freq: Once | INTRAMUSCULAR | Status: AC
  Administered 2015-06-21: 0.5 mL via INTRAMUSCULAR
  Filled 2015-06-21: qty 0.5

## 2015-06-21 NOTE — ED Notes (Signed)
Patient was discharged prior to taking this assignment.

## 2015-06-21 NOTE — ED Provider Notes (Signed)
CSN: [id number]     Arrival date & time 06/21/15  1338 History   First MD Initiated Contact with Patient 06/21/15 1420     Chief Complaint  Patient presents with  . Fall   HPI   Level 5 caveat due to dementia   74 year old female presents today via EMS for fall. EMS notes patient was with nursing staff at the time of the fall per RN. No LOC noted, patient normal per staff. At the time of evaluation patient continues asking where she is and who I am. She is polite and in no acute distress. She has no complaints other than the skin tear to her right forearm. Patient denies headache, head pain, neck pain, back pain, pain to the upper extremities, difficulty with ambulation, hip pain, lower extremity pain, swelling or edema. She denies chest pain, shortness of breath.   Past Medical History  Diagnosis Date  . Osteoporosis   . Dementia   . Depression   . Hypertension   . Malnutrition (HCC)   . Coronary artery disease   . Stroke (HCC)   . Alzheimer disease    History reviewed. No pertinent past surgical history. No family history on file. Social History  Substance Use Topics  . Smoking status: Never Smoker   . Smokeless tobacco: None  . Alcohol Use: No   OB History    No data available     Review of Systems  Unable to perform ROS: Dementia      Allergies  Review of patient's allergies indicates no known allergies.  Home Medications   Prior to Admission medications   Medication Sig Start Date End Date Taking? Authorizing Provider  alendronate (FOSAMAX) 70 MG tablet Take 70 mg by mouth once a week. Take with a full glass of water on an empty stomach. Patient takes on Sat.   Yes Historical Provider, MD  aspirin 81 MG tablet Take 1 tablet (81 mg total) by mouth daily. 11/02/13  Yes Marinda ElkAbraham Feliz Ortiz, MD  buPROPion [facility](WELLBUTRIN SR) 150 MG 12 hr tablet Take 150 mg by mouth daily.    Yes Historical Provider, MD  chlorhexidine (PERIDEX) 0.12 % solution Use as directed 15 mLs in  the mouth or throat 2 (two) times daily.   Yes Historical Provider, MD  divalproex (DEPAKOTE SPRINKLE) 125 MG capsule Take 250 mg by mouth at bedtime.    Yes Historical Provider, MD  galantamine (RAZADYNE ER) 16 MG 24 hr capsule Take 16 mg by mouth daily with breakfast.   Yes Historical Provider, MD  LORazepam (ATIVAN) 0.5 MG tablet Take 0.5 mg by mouth every 6 (six) hours as needed for anxiety.   Yes Historical Provider, MD  Multiple Vitamin (DAILY VITE) TABS Take 1 tablet by mouth daily.   Yes Historical Provider, MD  Skin Protectants, Misc. (BAZA PROTECT EX) Apply 1 application topically 2 (two) times daily.   Yes Historical Provider, MD  vitamin D, CHOLECALCIFEROL, 400 UNITS tablet Take 400 Units by mouth daily.   Yes Historical Provider, MD  rivastigmine (EXELON) 9.5 mg/24hr Place 1 patch (9.5 mg total) onto the skin daily. Patient not taking: Reported on 11/27/2014 05/18/14   Anson FretAntonia B Ahern, MD   BP 131/75 mmHg  Pulse 70  Temp(Src) 98.5 F (36.9 C) (Oral)  Resp 20  SpO2 97%   Physical Exam  Constitutional: She is oriented to person, place, and time. She appears well-developed and well-nourished.  HENT:  Head: Normocephalic and atraumatic.  Eyes: Conjunctivae are normal.  Pupils are equal, round, and reactive to light. Right eye exhibits no discharge. Left eye exhibits no discharge. No scleral icterus.  Neck: Normal range of motion. No JVD present. No tracheal deviation present.  Cardiovascular: Normal rate, regular rhythm, normal heart sounds and intact distal pulses.  Exam reveals no gallop and no friction rub.   No murmur heard. Pulmonary/Chest: Effort normal and breath sounds normal. No stridor. No respiratory distress. She has no wheezes. She has no rales. She exhibits no tenderness.  Abdominal: Soft. She exhibits no distension and no mass. There is no tenderness. There is no rebound and no guarding.  Musculoskeletal: Normal range of motion. She exhibits no edema.  Neck supple  full range of motion, nontender to palpation, back atraumatic nontender to palpation. No AP lateral compression pain to the chest, abdomen soft nontender, hips stable nontender, lower extremities full range of motion, nontender to palpation. Patients able to ambulate with a shuffling gait, no acute pain complaints with ambulation  Skin tear, strength 5 out of 5, radial pulses 2+, cap refill less than 3 seconds  Neurological: She is alert and oriented to person, place, and time. Coordination normal.  Skin: Skin is warm and dry. No rash noted. No erythema. No pallor.  5 cm skin tear to the right forearm, no signs of foreign body, major vessel tendon or nerve or muscular involvement. Nontender to palpation surrounding tissues  Psychiatric: She has a normal mood and affect. Her behavior is normal. Judgment and thought content normal.  Nursing note and vitals reviewed.      ED Course  Procedures (including critical care time)  LACERATION REPAIR Performed by: Thermon Leyland Authorized by: Thermon Leyland Consent: Verbal consent obtained. Risks and benefits: risks, benefits and alternatives were discussed Consent given by: patient Patient identity confirmed: provided demographic data Prepped and Draped in normal sterile fashion Wound explored  Laceration Location: right forearm   Laceration Length: 5 cm  No Foreign Bodies seen or palpated  Anesthesia: local infiltration  Local anesthetic: lidocaine 2% 0 epinephrine  Anesthetic total: 5 ml  Irrigation method: syringe Amount of cleaning: standard  Skin closure: simple  Number of sutures: 7  Technique: simple interrupted   Patient tolerance: Patient tolerated the procedure well with no immediate complications. Labs Review Labs Reviewed - No data to display  Imaging Review No results found. I have personally reviewed and evaluated these images and lab results as part of my medical decision-making.   EKG  Interpretation None      MDM   Final diagnoses:  Fall, initial encounter  Skin tear of forearm without complication, right, initial encounter    Labs:  Imaging:  Consults:  Therapeutics: Lidocaine  Discharge Meds:   Assessment/Plan: 74 year old female presents status post fall. No reported loss of consciousness, patient has no complaints other than the laceration to her right forearm. She has full active range of motion of all extremities, is able to ambulate without pain or difficulty. Patient appears to be demented, but she is in no acute distress, and appears to be at her baseline per chart review. Laceration repaired. The ED, patient will be transferred back to memory care facility with instructions to follow up with primary care in 5-7 days for suture removal, daily dressing changes, return if any signs of infection present.         Eyvonne Mechanic, PA-C 06/21/15 1818  Raeford Razor, MD 06/25/15 2110

## 2015-06-21 NOTE — ED Notes (Signed)
[facility] Expected date:  Expected time:  Means of arrival:  Comments: Fall/skin tear EMS

## 2015-06-21 NOTE — ED Notes (Signed)
[facility]. [facility] to pick up.

## 2015-06-21 NOTE — Discharge Instructions (Signed)
Please follow up with primary care provider in 5-7 days for suture removal. Daily dressing changes and wound check. Return to ED if signs of infection present.

## 2016-05-27 ENCOUNTER — Encounter (HOSPITAL_COMMUNITY): Payer: Self-pay | Admitting: Emergency Medicine

## 2016-05-27 ENCOUNTER — Emergency Department (HOSPITAL_COMMUNITY)
Admission: EM | Admit: 2016-05-27 | Discharge: 2016-05-28 | Disposition: A | Discharge status: Home / Self Care | Payer: Medicare Other | Attending: Emergency Medicine | Admitting: Emergency Medicine

## 2016-05-27 ENCOUNTER — Emergency Department (HOSPITAL_COMMUNITY): Payer: Medicare Other

## 2016-05-27 DIAGNOSIS — S0101XA Laceration without foreign body of scalp, initial encounter: Secondary | ICD-10-CM | POA: Insufficient documentation

## 2016-05-27 DIAGNOSIS — Y939 Activity, unspecified: Secondary | ICD-10-CM | POA: Insufficient documentation

## 2016-05-27 DIAGNOSIS — S0990XA Unspecified injury of head, initial encounter: Secondary | ICD-10-CM | POA: Diagnosis present

## 2016-05-27 DIAGNOSIS — Y999 Unspecified external cause status: Secondary | ICD-10-CM | POA: Insufficient documentation

## 2016-05-27 DIAGNOSIS — Z79899 Other long term (current) drug therapy: Secondary | ICD-10-CM | POA: Diagnosis not present

## 2016-05-27 DIAGNOSIS — Z7982 Long term (current) use of aspirin: Secondary | ICD-10-CM | POA: Diagnosis not present

## 2016-05-27 DIAGNOSIS — Y929 Unspecified place or not applicable: Secondary | ICD-10-CM | POA: Insufficient documentation

## 2016-05-27 DIAGNOSIS — W182XXA Fall in (into) shower or empty bathtub, initial encounter: Secondary | ICD-10-CM | POA: Insufficient documentation

## 2016-05-27 DIAGNOSIS — S06330A Contusion and laceration of cerebrum, unspecified, without loss of consciousness, initial encounter: Secondary | ICD-10-CM | POA: Insufficient documentation

## 2016-05-27 DIAGNOSIS — I2510 Atherosclerotic heart disease of native coronary artery without angina pectoris: Secondary | ICD-10-CM | POA: Insufficient documentation

## 2016-05-27 DIAGNOSIS — I10 Essential (primary) hypertension: Secondary | ICD-10-CM | POA: Diagnosis not present

## 2016-05-27 DIAGNOSIS — G309 Alzheimer's disease, unspecified: Secondary | ICD-10-CM | POA: Insufficient documentation

## 2016-05-27 DIAGNOSIS — W19XXXA Unspecified fall, initial encounter: Secondary | ICD-10-CM

## 2016-05-27 DIAGNOSIS — S062X0A Diffuse traumatic brain injury without loss of consciousness, initial encounter: Secondary | ICD-10-CM

## 2016-05-27 NOTE — ED Triage Notes (Signed)
Patient BIB [facility] from [facility] for witnessed fall. EMS reports patient was sitting in shower chair, leaned forward and fell. EMS reports hematoma to left forehead. Patient a/o x2 to person and place per norm. EMS placed towel roll to protect C-spine. Patient is DNR.

## 2016-05-27 NOTE — ED Notes (Signed)
Pt's POA, Laverta BaltimoreKen Knight, stated he was leaving the hospital.  His #  [phone].

## 2016-05-27 NOTE — ED Notes (Signed)
Patient transported to CT 

## 2016-05-27 NOTE — ED Provider Notes (Signed)
[facility]-EMERGENCY DEPT Provider Note   CSN: [id number] Arrival date & time: 05/27/16  2058  By signing my name below, I, Phillis HaggisGabriella Gaje, attest that this documentation has been prepared under the direction and in the presence of [employer] CorpHannah Merari Pion, PA-C. Electronically Signed: Phillis HaggisGabriella Gaje, ED Scribe. 05/27/16. 10:09 PM.  History   Chief Complaint Chief Complaint  Patient presents with  . Fall   The history is provided by a friend. No language interpreter was used.   HPI Comments (Level 5 Caveat due to dementia): Juan QuamCharlotte E Perkinson is a 75 y.o. female with a hx of Alzheimer's disease, dementia, CAD, HTN, malnutrition, osteoporosis, and hemorrhagic stroke brought in by EMS from [facility] who presents to the Emergency Department complaining of a fall onset PTA. Pt had a witnessed fall by staff where she was sitting in a shower chair, leaned forward, and fell. She has a hematoma to the left forehead. Pt says that she does not remember falling. Friend states that pt is not ambulatory on her own and has to have assistance. EMS denies LOC. Pt denies pain. Pt takes 81 mg aspirin but is not noted to take other blood thinners.   Past Medical History:  Diagnosis Date  . Alzheimer disease   . Coronary artery disease   . Dementia   . Depression   . Hypertension   . Malnutrition (HCC)   . Osteoporosis   . Stroke [facility](HCC)     Patient Active Problem List   Diagnosis Date Noted  . Cerebral parenchymal hemorrhage (HCC) 10/17/2013  . Coronary artery disease   . Hypertension   . Osteoporosis   . Dementia     History reviewed. No pertinent surgical history.  OB History    No data available     Home Medications    Prior to Admission medications   Medication Sig Start Date End Date Taking? Authorizing Provider  alendronate (FOSAMAX) 70 MG tablet Take 70 mg by mouth every Thursday. Take with a full glass of water on an empty stomach.   Yes Historical Provider, MD  aspirin EC 81 MG tablet  Take 81 mg by mouth daily.   Yes Historical Provider, MD  buPROPion (WELLBUTRIN SR) 150 MG 12 hr tablet Take 150 mg by mouth daily.    Yes Historical Provider, MD  chlorhexidine (PERIDEX) 0.12 % solution Use as directed 15 mLs in the mouth or throat 2 (two) times daily. Pt is to swish and spit.   Yes Historical Provider, MD  divalproex (DEPAKOTE SPRINKLE) 125 MG capsule Take 250 mg by mouth 2 (two) times daily.    Yes Historical Provider, MD  galantamine (RAZADYNE ER) 16 MG 24 hr capsule Take 16 mg by mouth daily with breakfast.   Yes Historical Provider, MD  Multiple Vitamin (MULTIVITAMIN WITH MINERALS) TABS tablet Take 1 tablet by mouth daily.   Yes Historical Provider, MD  Skin Protectants, Misc. (BAZA PROTECT EX) Apply 1 application topically 2 (two) times daily.    Yes Historical Provider, MD  vitamin D, CHOLECALCIFEROL, 400 UNITS tablet Take 400 Units by mouth daily.   Yes Historical Provider, MD    Family History History reviewed. No pertinent family history.  Social History Social History  Substance Use Topics  . Smoking status: Never Smoker  . Smokeless tobacco: Never Used  . Alcohol use No     Allergies   Review of patient's allergies indicates no known allergies.   Review of Systems Review of Systems  Unable to perform ROS:  Dementia   Physical Exam Updated Vital Signs BP 143/74 (BP Location: Left Arm)   Pulse 69   Temp 99 F (37.2 C) (Oral)   Resp 17   SpO2 100%   Physical Exam  Constitutional: She appears well-developed and well-nourished. No distress.  Awake, alert, nontoxic appearance  HENT:  Head: Normocephalic.  Mouth/Throat: Oropharynx is clear and moist. No oropharyngeal exudate.  Superficial skin tear to the left scalp. Small amount of bleeding noted.   Eyes: Conjunctivae are normal. No scleral icterus.  Neck: Normal range of motion. Neck supple.  Cardiovascular: Normal rate, regular rhythm and intact distal pulses.   Pulmonary/Chest: Effort normal  and breath sounds normal. No respiratory distress. She has no wheezes.  Equal chest expansion  Abdominal: Soft. Bowel sounds are normal. She exhibits no mass. There is no tenderness. There is no rebound and no guarding.  Musculoskeletal: Normal range of motion. She exhibits no edema.  Neurological: She is alert.  Speech is clear and goal oriented Moves extremities without ataxia  Skin: Skin is warm and dry. She is not diaphoretic.  Skin tear to right upper arm  Psychiatric: She has a normal mood and affect.  Nursing note and vitals reviewed.  ED Treatments / Results  DIAGNOSTIC STUDIES: Oxygen Saturation is 98% on RA, normal by my interpretation.    COORDINATION OF CARE: 10:01 PM-CT scans and laceration repair; Pt's friend who is in the ED with her tonight is her POA. He agrees to CT scans and laceration repair. He requests that extreme measures not be taken for pt's care.    Labs (all labs ordered are listed, but only abnormal results are displayed) Labs Reviewed  BASIC METABOLIC PANEL - Abnormal; Notable for the following:       Result Value   Glucose, Bld 106 (*)    BUN 21 (*)    Creatinine, Ser 1.02 (*)    GFR calc non Af Amer 54 (*)    All other components within normal limits  CBC    Radiology Ct Head Wo Contrast  Result Date: 05/27/2016 CLINICAL DATA:  Fall with hematoma EXAM: CT HEAD WITHOUT CONTRAST CT CERVICAL SPINE WITHOUT CONTRAST TECHNIQUE: Multidetector CT imaging of the head and cervical spine was performed following the standard protocol without intravenous contrast. Multiplanar CT image reconstructions of the cervical spine were also generated. COMPARISON:  10/29/2013 FINDINGS: CT HEAD FINDINGS Brain: No acute territorial infarction or focal mass lesion is visualized. Small focal area of increased density along the cortex of the right parasagittal frontal lobe, series 18, image number 15 and series 13, image number 25. This would be consistent with a small  cortical contusion. No significant mass effect. Moderate periventricular and subcortical white matter hypodensity, consistent with small vessel ischemic changes. Ventricles are slightly enlarged but similar compared to previous. Mild to moderate global atrophy. Vascular: Calcifications are present within the carotid arteries at the skullbase. No hyperdense vessels. Skull: Mastoid air cells clear.  There is no skull fracture. Sinuses/Orbits: Paranasal sinuses are grossly clear. No acute orbital abnormality. Other: There is a left frontal scalp hematoma. CT CERVICAL SPINE FINDINGS Alignment: Mild anterior listhesis of C3 on C4 and C4 on C5, similar compared to prior. There is straightening of the cervical spine. Skull base and vertebrae: Craniovertebral junction is intact. Vertebral body heights are maintained. No fracture identified. Soft tissues and spinal canal: Prevertebral soft tissue thickness is normal. No bony impingement of the spinal canal. Disc levels: Mild narrowing at C3-C4 and  C4-C5 with more moderate to marked narrowing at C5-C6 and C6-C7. Endplate changes present at C5-C6 and C6-C7 with vacuum disc. There is anterior osteophytosis. Small posterior disc osteophyte complexes are present at C4-C5, C5-C6, C6-C7. Bilateral right greater than left hypertrophic facet disease at multiple levels. Upper chest: Lung apices demonstrate small foci of scarring. Possible hypodense nodule in the left lobe of the thyroid gland. Other: None IMPRESSION: 1. Small focal area of increased density along the cortical surface of the right parasagittal frontal lobe, suspicious for small cortical contusion. No associated mass effect. Critical Value/emergent results were called by telephone at the time of interpretation on 05/27/2016 at 11:57 pm to Dr. Dierdre Forth , who verbally acknowledged these results. 2. Atrophy with moderate periventricular and subcortical white matter hypodensities, most likely small vessel ischemic  disease. Moderate left scalp frontal hematoma. 3. Multilevel degenerative disc disease of the cervical spine. No acute fracture or malalignment identified. Electronically Signed   By: Jasmine Pang M.D.   On: 05/27/2016 23:57   Ct Cervical Spine Wo Contrast  Result Date: 05/27/2016 CLINICAL DATA:  Fall with hematoma EXAM: CT HEAD WITHOUT CONTRAST CT CERVICAL SPINE WITHOUT CONTRAST TECHNIQUE: Multidetector CT imaging of the head and cervical spine was performed following the standard protocol without intravenous contrast. Multiplanar CT image reconstructions of the cervical spine were also generated. COMPARISON:  10/29/2013 FINDINGS: CT HEAD FINDINGS Brain: No acute territorial infarction or focal mass lesion is visualized. Small focal area of increased density along the cortex of the right parasagittal frontal lobe, series 18, image number 15 and series 13, image number 25. This would be consistent with a small cortical contusion. No significant mass effect. Moderate periventricular and subcortical white matter hypodensity, consistent with small vessel ischemic changes. Ventricles are slightly enlarged but similar compared to previous. Mild to moderate global atrophy. Vascular: Calcifications are present within the carotid arteries at the skullbase. No hyperdense vessels. Skull: Mastoid air cells clear.  There is no skull fracture. Sinuses/Orbits: Paranasal sinuses are grossly clear. No acute orbital abnormality. Other: There is a left frontal scalp hematoma. CT CERVICAL SPINE FINDINGS Alignment: Mild anterior listhesis of C3 on C4 and C4 on C5, similar compared to prior. There is straightening of the cervical spine. Skull base and vertebrae: Craniovertebral junction is intact. Vertebral body heights are maintained. No fracture identified. Soft tissues and spinal canal: Prevertebral soft tissue thickness is normal. No bony impingement of the spinal canal. Disc levels: Mild narrowing at C3-C4 and C4-C5 with more  moderate to marked narrowing at C5-C6 and C6-C7. Endplate changes present at C5-C6 and C6-C7 with vacuum disc. There is anterior osteophytosis. Small posterior disc osteophyte complexes are present at C4-C5, C5-C6, C6-C7. Bilateral right greater than left hypertrophic facet disease at multiple levels. Upper chest: Lung apices demonstrate small foci of scarring. Possible hypodense nodule in the left lobe of the thyroid gland. Other: None IMPRESSION: 1. Small focal area of increased density along the cortical surface of the right parasagittal frontal lobe, suspicious for small cortical contusion. No associated mass effect. Critical Value/emergent results were called by telephone at the time of interpretation on 05/27/2016 at 11:57 pm to Dr. Dierdre Forth , who verbally acknowledged these results. 2. Atrophy with moderate periventricular and subcortical white matter hypodensities, most likely small vessel ischemic disease. Moderate left scalp frontal hematoma. 3. Multilevel degenerative disc disease of the cervical spine. No acute fracture or malalignment identified. Electronically Signed   By: Jasmine Pang M.D.   On: 05/27/2016 23:57  Procedures .Marland KitchenLaceration Repair Date/Time: 05/27/2016 10:20 PM Performed by: Dierdre Forth Authorized by: Dierdre Forth   Consent:    Consent obtained:  Verbal   Consent given by: Marshia Ly. Anesthesia (see MAR for exact dosages):    Anesthesia method:  Local infiltration Laceration details:    Location:  Scalp   Scalp location:  L parietal Repair type:    Repair type:  Simple Pre-procedure details:    Preparation:  Patient was prepped and draped in usual sterile fashion Exploration:    Hemostasis achieved with:  Direct pressure Treatment:    Amount of cleaning:  Standard   Irrigation solution:  Sterile saline Skin repair:    Repair method:  Staples   Number of staples:  1 Approximation:    Approximation:  Close   Vermilion border:  well-aligned   Post-procedure details:    Patient tolerance of procedure:  Tolerated well, no immediate complications    (including critical care time)  Medications Ordered in ED Medications - No data to display   Initial Impression / Assessment and Plan / ED Course  I have reviewed the triage vital signs and the nursing notes.  Pertinent labs & imaging results that were available during my care of the patient were reviewed by me and considered in my medical decision making (see chart for details).  Clinical Course  Value Comment By Time  CT Head Wo Contrast Left-sided scalp hematoma consistent with clinical exam. Right frontal lobe contusion without midline shift.   Dahlia Client Raidyn Wassink, PA-C 10/10 0006   Discussed with Dr. Maryfrances Bunnell who recommends neurosurgery consult Dierdre Forth, PA-C 10/10 0121   Discussed with Dr. Lovell Sheehan who recommends obs admission if family wants intervention should pt have worsening edema, but feels pt is safe for d/c home if family wants no invasive intervention should the situation worsen.   Kelita Wallis, PA-C 10/10 0155   Multiple attempts to reach POA. Voicemail left. No return phone call.  Based on lengthy discussion prior to his departure, POA did not want invasive measures patient were to have intracranial hemorrhage.  Her fall was mechanical and witnessed. I do not see the utility of a medical admission for reasons aside from the cortex contusion.  Jatinder Mcdonagh, PA-C 10/10 [phone]   Her VS have remained stable and she has been arousable without difficulty while in the ED.  Will plan for d/c back to her facility operating under the initial conversation with her POA.  Will continue to attempt to reach him.   Tyiana Hill, PA-C 10/10 0359    Pt With cortex contusion. Long discussion with POA prior to departure indicated that they did not want any invasive therapies. Unable to reach the POA after multiple attempts. Discussed with Dr. Effie Shy  who evaluated the patient and he agrees with neurosurgery that patient can be discharged back to her facility if POA does not want interventions.  Patient remains stable Shawnie Pons her time here in the emergency department.  Final Clinical Impressions(s) / ED Diagnoses   Final diagnoses:  Fall, initial encounter  Injury of head, initial encounter  Cortex (cerebral) contusion, no loss of consciousness, initial encounter [facility])   I personally performed the services described in this documentation, which was scribed in my presence. The recorded information has been reviewed and is accurate.   New Prescriptions New Prescriptions   No medications on file     Dierdre Forth, PA-C 05/28/16 0440    Mancel Bale, MD 05/28/16 1020

## 2016-05-27 NOTE — ED Notes (Signed)
Bed: [id number] Expected date:  Expected time:  Means of arrival:  Comments: 75 yo F Fall/ nursing home

## 2016-05-28 DIAGNOSIS — S06330A Contusion and laceration of cerebrum, unspecified, without loss of consciousness, initial encounter: Secondary | ICD-10-CM | POA: Diagnosis not present

## 2016-05-28 LAB — CBC
HCT: 39 % (ref 36.0–46.0)
HEMOGLOBIN: 12.9 g/dL (ref 12.0–15.0)
MCH: 29.8 pg (ref 26.0–34.0)
MCHC: 33.1 g/dL (ref 30.0–36.0)
MCV: 90.1 fL (ref 78.0–100.0)
PLATELETS: 264 10*3/uL (ref 150–400)
RBC: 4.33 MIL/uL (ref 3.87–5.11)
RDW: 13.6 % (ref 11.5–15.5)
WBC: 7 10*3/uL (ref 4.0–10.5)

## 2016-05-28 LAB — BASIC METABOLIC PANEL
Anion gap: 6 (ref 5–15)
BUN: 21 mg/dL — ABNORMAL HIGH (ref 6–20)
CO2: 24 mmol/L (ref 22–32)
Calcium: 8.9 mg/dL (ref 8.9–10.3)
Chloride: 110 mmol/L (ref 101–111)
Creatinine, Ser: 1.02 mg/dL — ABNORMAL HIGH (ref 0.44–1.00)
GFR calc Af Amer: >60 mL/min (ref >60)
GFR calc non Af Amer: 54 mL/min — ABNORMAL LOW (ref >60)
Glucose, Bld: 106 mg/dL — ABNORMAL HIGH (ref 65–99)
Potassium: 3.9 mmol/L (ref 3.5–5.1)
Sodium: 140 mmol/L (ref 135–145)

## 2016-05-28 NOTE — ED Provider Notes (Signed)
  Face-to-face evaluation   History: She fell from a shower chair, witness fall. No reported loss of consciousness. She is unable to give history.  Physical exam: Elderly female, alert, somewhat uncooperative with exam. Left forehead contusion and abrasion. Normal tone. Arms and legs bilaterally. Scattered abrasions, hands and left shoulder.  Medical screening examination/treatment/procedure(s) were conducted as a shared visit with non-physician practitioner(s) and myself.  I personally evaluated the patient during the encounter   Mancel BaleElliott Karina Lenderman, MD 05/28/16 1020

## 2016-05-28 NOTE — Discharge Instructions (Signed)
1. Medications: usual home medications 2. Treatment: rest, drink plenty of fluids, Keep wound clean with warm soap and water. Apply pressure if bleeding begins 3. Follow Up: Please followup with your primary doctor in 24 hours for discussion of your diagnoses and further evaluation after today's visit; if you do not have a primary care doctor use the resource guide provided to find one; Please return to the ER for seizures, increasing lethargy or other signs of altered mental status

## 2017-06-04 ENCOUNTER — Emergency Department (HOSPITAL_COMMUNITY)
Admission: EM | Admit: 2017-06-04 | Discharge: 2017-06-04 | Disposition: A | Discharge status: Home / Self Care | Attending: Emergency Medicine | Admitting: Emergency Medicine

## 2017-06-04 ENCOUNTER — Encounter (HOSPITAL_COMMUNITY): Payer: Self-pay | Admitting: Emergency Medicine

## 2017-06-04 ENCOUNTER — Emergency Department (HOSPITAL_COMMUNITY)

## 2017-06-04 DIAGNOSIS — Z8673 Personal history of transient ischemic attack (TIA), and cerebral infarction without residual deficits: Secondary | ICD-10-CM | POA: Insufficient documentation

## 2017-06-04 DIAGNOSIS — N183 Chronic kidney disease, stage 3 (moderate): Secondary | ICD-10-CM | POA: Insufficient documentation

## 2017-06-04 DIAGNOSIS — Y999 Unspecified external cause status: Secondary | ICD-10-CM | POA: Insufficient documentation

## 2017-06-04 DIAGNOSIS — S22009A Unspecified fracture of unspecified thoracic vertebra, initial encounter for closed fracture: Secondary | ICD-10-CM | POA: Insufficient documentation

## 2017-06-04 DIAGNOSIS — Y929 Unspecified place or not applicable: Secondary | ICD-10-CM | POA: Insufficient documentation

## 2017-06-04 DIAGNOSIS — S2232XA Fracture of one rib, left side, initial encounter for closed fracture: Secondary | ICD-10-CM | POA: Diagnosis not present

## 2017-06-04 DIAGNOSIS — W19XXXA Unspecified fall, initial encounter: Secondary | ICD-10-CM | POA: Diagnosis not present

## 2017-06-04 DIAGNOSIS — I129 Hypertensive chronic kidney disease with stage 1 through stage 4 chronic kidney disease, or unspecified chronic kidney disease: Secondary | ICD-10-CM | POA: Diagnosis not present

## 2017-06-04 DIAGNOSIS — Y939 Activity, unspecified: Secondary | ICD-10-CM | POA: Diagnosis not present

## 2017-06-04 DIAGNOSIS — Z0489 Encounter for examination and observation for other specified reasons: Secondary | ICD-10-CM | POA: Diagnosis present

## 2017-06-04 DIAGNOSIS — Z7982 Long term (current) use of aspirin: Secondary | ICD-10-CM | POA: Diagnosis not present

## 2017-06-04 DIAGNOSIS — F0390 Unspecified dementia without behavioral disturbance: Secondary | ICD-10-CM | POA: Diagnosis not present

## 2017-06-04 DIAGNOSIS — I2510 Atherosclerotic heart disease of native coronary artery without angina pectoris: Secondary | ICD-10-CM | POA: Diagnosis not present

## 2017-06-04 DIAGNOSIS — Z79899 Other long term (current) drug therapy: Secondary | ICD-10-CM | POA: Insufficient documentation

## 2017-06-04 DIAGNOSIS — S22000A Wedge compression fracture of unspecified thoracic vertebra, initial encounter for closed fracture: Secondary | ICD-10-CM

## 2017-06-04 HISTORY — DX: Chronic kidney disease, stage 3 unspecified: N18.30

## 2017-06-04 HISTORY — DX: Chronic kidney disease, stage 3 (moderate): N18.3

## 2017-06-04 NOTE — ED Triage Notes (Signed)
Pt brought [facility] by EMS [facility] after she had an unwitnessed fall  Pt has no complaints but the facility sent her in for evaluation per their protocol  Pt has dementia

## 2017-06-04 NOTE — Progress Notes (Signed)
[facility] Liaison: RN visit  Patient brought to ED for evaluation after a fall at ALF.  Visited patient in room. She is in no distress and resting comfortably. Xray shows Left posterior 2nd rib fracture.  Nondisplaced. Spoke with RN and the plan is for patient to discharge back to facility with report called to facility by RN.   Please call with any hospice related questions.  [facility] contracts with [facility] to transport [facility] patients, please contact [facility] at time of discharge for transport home.   Thank you,  Elsie SaasMary Anne Robertson, RN, [facility]  [facility] Liaison [phone]  All hospital liaisons are on AMION.

## 2017-06-04 NOTE — ED Provider Notes (Signed)
[facility]-EMERGENCY DEPT Provider Note   CSN: [id number] Arrival date & time: 06/04/17  0532     History   Chief Complaint Chief Complaint  Patient presents with  . Fall    HPI Alexa Knox is a 76 y.o. female.  The history is provided by medical records. The history is limited by the condition of the patient.  Fall  This is a new problem. Episode onset: unknown. The problem occurs constantly. The problem has not changed since onset.Pertinent negatives include no chest pain and no abdominal pain. Nothing aggravates the symptoms. Nothing relieves the symptoms. She has tried nothing for the symptoms. The treatment provided no relief.    Past Medical History:  Diagnosis Date  . Alzheimer disease   . Chronic kidney disease (CKD), stage III (moderate) (HCC)   . Coronary artery disease   . Dementia   . Depression   . Hypertension   . Malnutrition (HCC)   . Osteoporosis   . Stroke [facility](HCC)     Patient Active Problem List   Diagnosis Date Noted  . Cerebral parenchymal hemorrhage (HCC) 10/17/2013  . Coronary artery disease   . Hypertension   . Osteoporosis   . Dementia     History reviewed. No pertinent surgical history.  OB History    No data available       Home Medications    Prior to Admission medications   Medication Sig Start Date End Date Taking? Authorizing Provider  alendronate (FOSAMAX) 70 MG tablet Take 70 mg by mouth every Thursday. Take with a full glass of water on an empty stomach.    [provider]  aspirin EC 81 MG tablet Take 81 mg by mouth daily.    [provider]  buPROPion (WELLBUTRIN SR) 150 MG 12 hr tablet Take 150 mg by mouth daily.     [provider]  chlorhexidine (PERIDEX) 0.12 % solution Use as directed 15 mLs in the mouth or throat 2 (two) times daily. Pt is to swish and spit.    [provider]  divalproex (DEPAKOTE SPRINKLE) 125 MG capsule Take 250 mg by mouth 2 (two)  times daily.     [provider]  galantamine (RAZADYNE ER) 16 MG 24 hr capsule Take 16 mg by mouth daily with breakfast.    [provider]  Multiple Vitamin (MULTIVITAMIN WITH MINERALS) TABS tablet Take 1 tablet by mouth daily.    [provider]  Skin Protectants, Misc. (BAZA PROTECT EX) Apply 1 application topically 2 (two) times daily.     [provider]  vitamin D, CHOLECALCIFEROL, 400 UNITS tablet Take 400 Units by mouth daily.    [provider]    Family History No family history on file.  Social History Social History  Substance Use Topics  . Smoking status: Never Smoker  . Smokeless tobacco: Never Used  . Alcohol use No     Allergies   Patient has no known allergies.   Review of Systems Review of Systems  Unable to perform ROS: Other  Eyes: Negative for redness.  Cardiovascular: Negative for chest pain.  Gastrointestinal: Negative for abdominal pain.     Physical Exam Updated Vital Signs There were no vitals taken for this visit.  Physical Exam  Constitutional: She is oriented to person, place, and time. She appears well-developed and well-nourished. No distress.  HENT:  Head: Normocephalic and atraumatic. Head is without raccoon's eyes and without Battle's sign.  Right Ear:  No hemotympanum.  Left Ear: No hemotympanum.  Mouth/Throat: Oropharynx is clear and moist. No oropharyngeal exudate.  Eyes: Pupils are equal, round, and reactive to light. Conjunctivae and EOM are normal.  Neck: Normal range of motion. Neck supple.  Cardiovascular: Normal rate, regular rhythm, normal heart sounds and intact distal pulses.   Pulmonary/Chest: Effort normal and breath sounds normal. She has no wheezes. She has no rales.  Abdominal: Soft. Bowel sounds are normal. She exhibits no mass. There is no tenderness. There is no rebound and no guarding.  Musculoskeletal: Normal range of motion.  Neurological: She is alert and oriented to  person, place, and time. She displays normal reflexes.  Skin: Skin is warm and dry. Capillary refill takes less than 2 seconds.  Psychiatric: She has a normal mood and affect.  Nursing note and vitals reviewed.    ED Treatments / Results  There were no vitals filed for this visit.  Labs (all labs ordered are listed, but only abnormal results are displayed) Labs Reviewed - No data to display   Case d/w Dr. Venetia Maxon, a collar would make her more uncomfortable.  Stable for discharge.     Procedures Procedures (including critical care time)    Final Clinical Impressions(s) / ED Diagnoses   Strict return precautions given for  Shortness of breath, swelling or the lips or tongue, chest pain, dyspnea on exertion, new weakness or numbness changes in vision or speech,  Inability to tolerate liquids or food, changes in voice cough, altered mental status or any concerns. No signs of systemic illness or infection. The patient is nontoxic-appearing on exam and vital signs are within normal limits.    I have reviewed the triage vital signs and the nursing notes. Pertinent labs &imaging results that were available during my care of the patient were reviewed by me and considered in my medical decision making (see chart for details).  After history, exam, and medical workup I feel the patient has been appropriately medically screened and is safe for discharge home. Pertinent diagnoses were discussed with the patient. Patient was given return precautions.    Gatlyn Lipari, MD 06/04/17 [phone]

## 2017-11-17 DEATH — deceased

## 2018-04-05 IMAGING — CT CT CERVICAL SPINE W/O CM
3 of 9 series · 11 of 33 positions shown, 12 images · non-contrast
Comparison: 05/27/2016

CLINICAL DATA: Unwitnessed fall.  History of dementia.

EXAM:
CT HEAD WITHOUT CONTRAST
CT CERVICAL SPINE WITHOUT CONTRAST
TECHNIQUE: Multidetector CT imaging of the head and cervical spine was
performed following the standard protocol without intravenous
contrast. Multiplanar CT image reconstructions of the cervical spine
were also generated.

[Series 6: sagittal · sagittal · 0.31mm/px · 5 of 50 slices shown]
[im 9/50  bone]
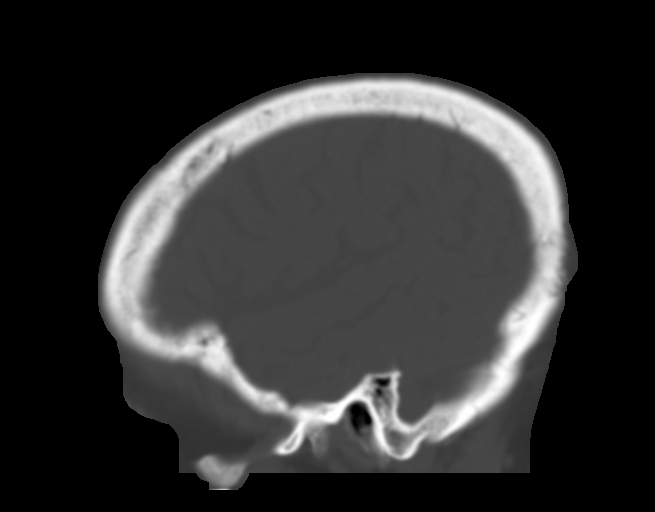
[im 17/50  bone]
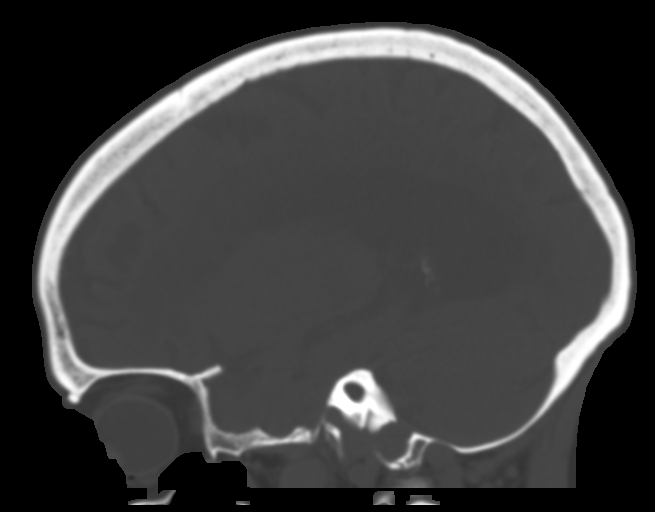
[im 25/50  bone]
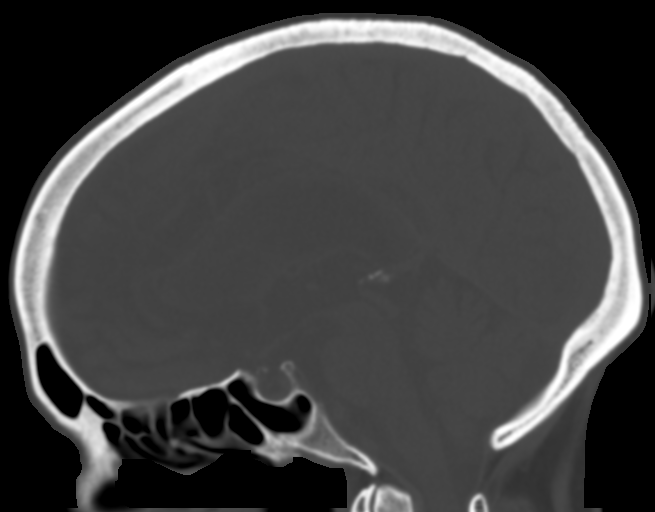
[im 33/50  bone]
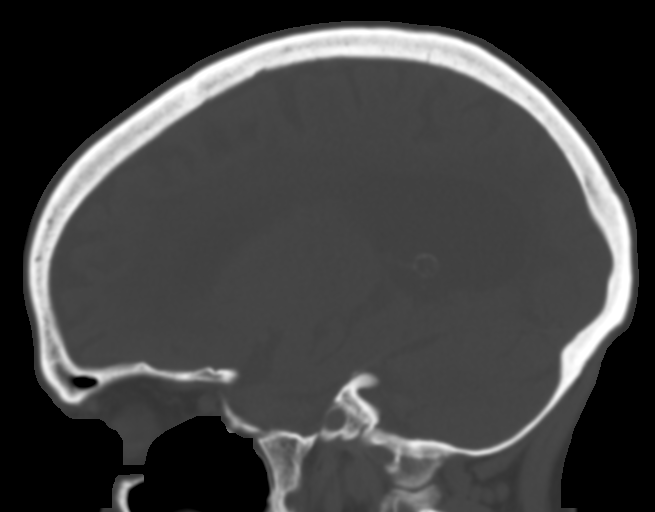
[im 41/50  bone]
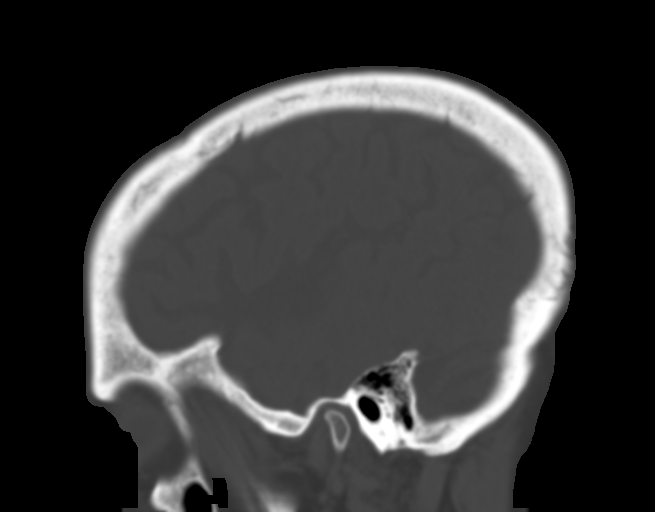

[Series 8: c-spine st · axial · 0.35mm/px · z∈[-304,-212]mm · 3 of 93 slices shown, 4 images]
[im 24/93  soft-tissue]
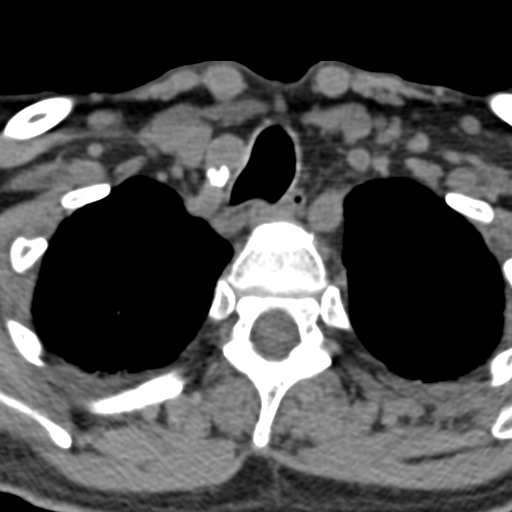
[im 24/93  bone]
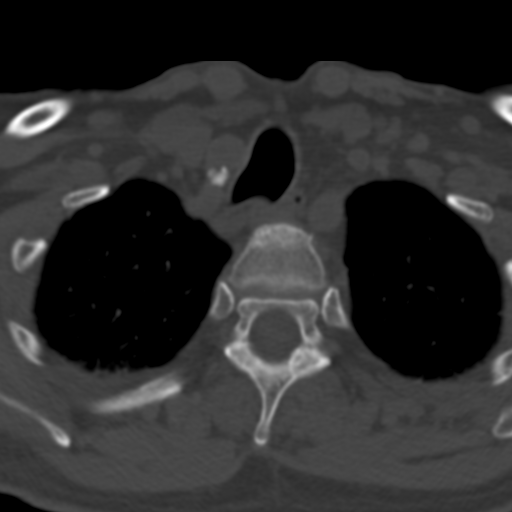
[im 47/93  bone]
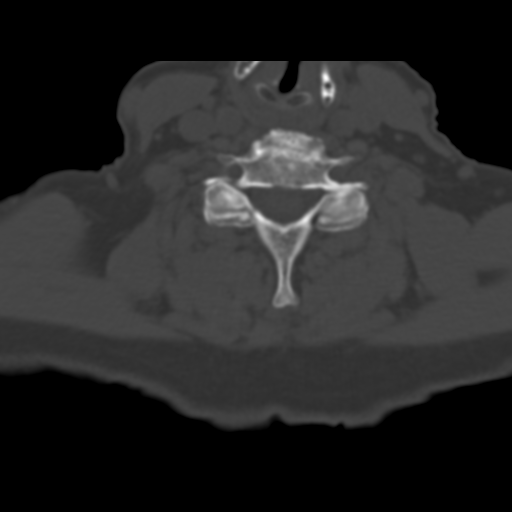
[im 70/93  bone]
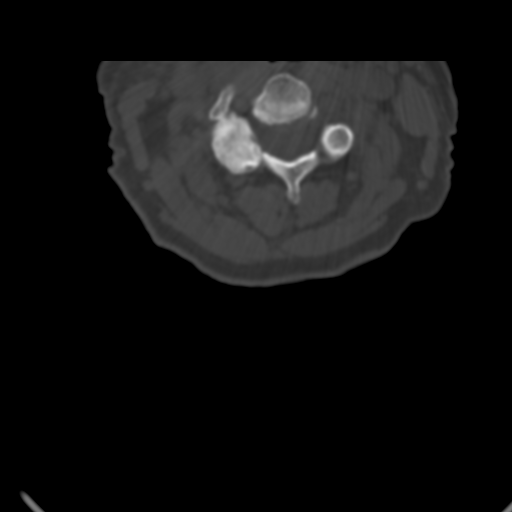

[Series 10: axial recon · axial · 0.20mm/px · z∈[-325,-236]mm · 3 of 100 slices shown]
[im 25/100  bone]
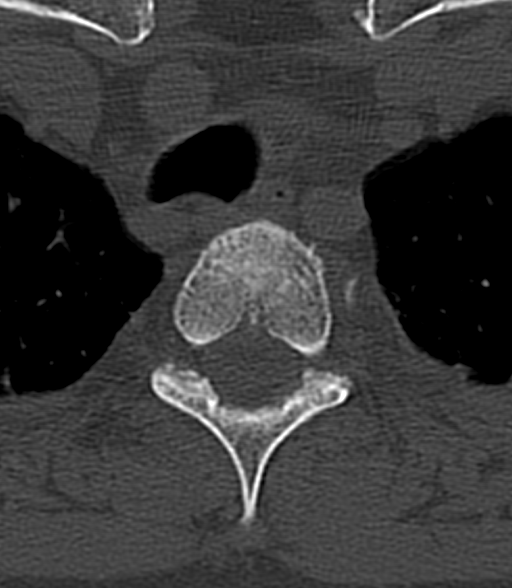
[im 50/100  bone]
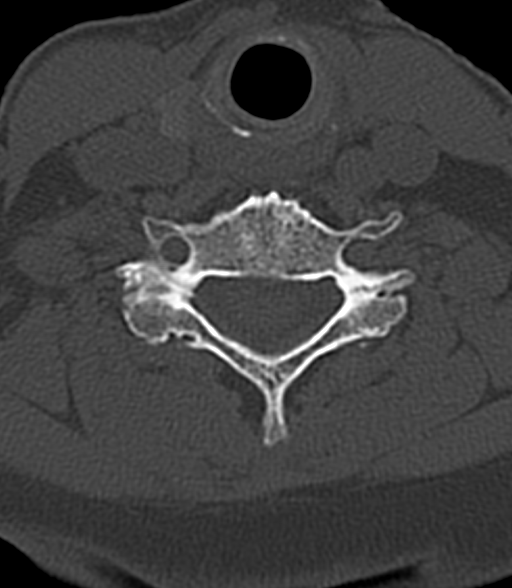
[im 75/100  bone]
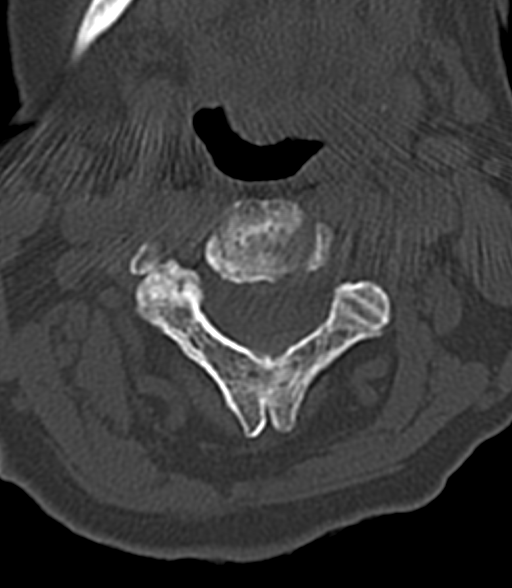

[11 of 33 positions shown; findings below may reference images not displayed]

FINDINGS: CT HEAD FINDINGS

Brain: Diffuse cerebral atrophy. Ventricular dilatation likely due
to central atrophy. Low-attenuation changes in the deep white matter
consistent with small vessel ischemia. No mass effect or midline
shift. No abnormal extra-axial fluid collections. Gray-white matter
junctions are distinct. Basal cisterns are not effaced. No acute
intracranial hemorrhage.

Vascular: Intracranial arterial calcifications are present.

Skull: Calvarium appears intact.

Sinuses/Orbits: Mild mucosal thickening in the paranasal sinuses. No
acute air-fluid levels. Mastoid air cells on the right are
hypoaerated. Opacification of a few bilateral mastoid air cells.

Other: None.

CT CERVICAL SPINE FINDINGS

Alignment: Alignment of the cervical spine is similar to previous
study. There is reversal of the usual cervical lordosis with mild
anterior subluxation at C3-4. Changes are likely degenerative.
Normal alignment of the facet joints. C1-2 articulation appears
intact.

Skull base and vertebrae: Skullbase appears intact. Mild compression
of the superior anterior endplate at T2. Sclerosis and suggestion of
cortical irregularity. This area was not well included on the
previous study. Can't exclude acute anterior compression at T2. No
cervical compression deformities are identified. No focal bone
lesion or bone destruction. Bone cortex and trabecular architecture
appear intact.

Soft tissues and spinal canal: No prevertebral soft tissue swelling.
No paraspinal mass or infiltration.

Disc levels: Degenerative changes throughout the cervical spine with
narrowed interspaces and endplate hypertrophic changes. Degenerative
changes are most prominent at C4-5, C5-6, and C6-7 levels.
Degenerative changes throughout the cervical facet joints.

Upper chest: Interstitial and alveolar infiltration in the lung
apices likely representing edema.

Other: There is an acute minimally displaced fracture of the
proximal left second rib at the costochondral junction.
IMPRESSION: 1. No acute intracranial abnormalities. Chronic atrophy and small
vessel ischemic changes.
2. Alignment of the cervical spine is unchanged since previous
study. No acute displaced cervical spine fractures are identified.
Degenerative changes.
3. Slight compression of the anterior superior endplate of T2
tonight with sclerosis and suggestion of cortical irregularity. This
area was not well included on the previous study. Acute fracture is
not excluded.
4. Nondisplaced acute fracture of the proximal left second rib.
5. Probable pulmonary edema.
